# Patient Record
Sex: Female | Born: 1995 | Race: White | Hispanic: No | Marital: Married | Smoking: Never smoker
Health system: Southern US, Community
[De-identification: ages and names within clinical notes are randomized; demographics above are authoritative.]

## PROBLEM LIST (undated history)

## (undated) ENCOUNTER — Inpatient Hospital Stay: Payer: Self-pay

## (undated) DIAGNOSIS — D649 Anemia, unspecified: Secondary | ICD-10-CM

## (undated) DIAGNOSIS — E559 Vitamin D deficiency, unspecified: Secondary | ICD-10-CM

## (undated) DIAGNOSIS — K219 Gastro-esophageal reflux disease without esophagitis: Secondary | ICD-10-CM

## (undated) DIAGNOSIS — R569 Unspecified convulsions: Secondary | ICD-10-CM

## (undated) DIAGNOSIS — N809 Endometriosis, unspecified: Secondary | ICD-10-CM

## (undated) DIAGNOSIS — E282 Polycystic ovarian syndrome: Secondary | ICD-10-CM

## (undated) HISTORY — DX: Anemia, unspecified: D64.9

## (undated) HISTORY — PX: WISDOM TOOTH EXTRACTION: SHX21

## (undated) HISTORY — DX: Endometriosis, unspecified: N80.9

## (undated) HISTORY — DX: Vitamin D deficiency, unspecified: E55.9

## (undated) HISTORY — PX: KNEE ARTHROSCOPY: SUR90

---
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
## 2014-02-15 ENCOUNTER — Ambulatory Visit: Payer: Self-pay | Admitting: Family Medicine

## 2014-03-14 ENCOUNTER — Ambulatory Visit: Payer: Self-pay

## 2014-06-02 NOTE — H&P (Signed)
PATIENT NAMFransico Meadow:  SNUFFER, Blythe L MR#:  [id number] DATE OF BIRTH:  04-Jun-1995  DATE OF ADMISSION:  04/05/2012  ADMITTING DIAGNOSES:  1.  Gastroenteritis.  2.  Dehydration.   HISTORY OF PRESENT ILLNESS:  This is the first [facility] admission for this 19 year old white female who was in her usual state of good health until the day of admission at which time she developed vomiting and diarrhea. The vomiting was described as nonbilious, non-projectile, 8 to 10 times over the previous 12 hours and had progressed to the point of dry heaving. The diarrhea was described 8 to 10 with no blood or mucus and mild abdominal cramping. There was no history of fever. The patient took less than 8 ounces of Pedialyte on day of admission and had voided 3 times with small amounts. The patient was seen in the office on the evening of admission and was found to be clinically moderately dehydrated. The pulse lying was 107, sitting 110 and standing 124. The blood pressure lying was 110/80, sitting 101/80 and standing 98/80. Oximetry was 98%. Options were discussed with the patient and her mother regarding rehydration and further evaluation. It was elected to admit the patient for further evaluation and treatment.   ADMISSION PHYSICAL EXAMINATION: VITALS: Temperature was 98 degrees, weight was 112, oximetry was 98% on room air, pulse was 107, blood pressure 110/80.  GENERAL: She was a well-developed moderately dehydrated adolescent appearing uncomfortable, but in no acute distress.  HEENT: Pupils were equal, round, and reactive to light. EOMs were full. Tympanic membranes are clear. Nose was clear. Oropharynx had dry mucosa. Lips were dry and chapped.  NECK: Supple. There were no enlarged lymph glands.  LUNGS: Fields were clear to auscultation.  HEART: There was a slightly tachycardic heart rate that was regular.  EXTREMITIES: Capillary refill was less than 2 seconds and pulses were 2+.  ABDOMEN: Soft  without distention, masses or organomegaly. Bowel sounds were hyperactive. There was mild tenderness on the right side of the abdomen without guarding or rebound tenderness. The abdomen was nondistended. There was no enlarged liver or spleen.  SKIN: Dry lips and mucosa, but there was good skin turgor.  NEUROLOGIC: There were no deficits or focal findings.   ASSESSMENT:  Approximately a 12 to 14 hour history of vomiting and diarrhea progressing to the point of clinically moderate dehydration.   PLAN:  Please see orders.    ____________________________ Tresa Resavid S. Johnson, MD dsj:si D: 04/05/2012 22:12:00 ET T: 04/06/2012 00:03:21 ET JOB#: [id number]  cc: Tresa Resavid S. Johnson, MD, <Dictator> DAVID Henriette CombsS JOHNSON MD ELECTRONICALLY SIGNED 04/06/2012 10:18

## 2014-08-11 DIAGNOSIS — R569 Unspecified convulsions: Secondary | ICD-10-CM

## 2014-08-11 HISTORY — DX: Unspecified convulsions: R56.9

## 2014-11-03 ENCOUNTER — Ambulatory Visit: Admission: RE | Admit: 2014-11-03 | Payer: BC Managed Care – PPO | Source: Ambulatory Visit

## 2014-11-06 ENCOUNTER — Encounter
Admission: RE | Admit: 2014-11-06 | Discharge: 2014-11-06 | Disposition: A | Discharge status: Home / Self Care | Payer: BC Managed Care – PPO | Source: Ambulatory Visit | Attending: Obstetrics and Gynecology | Admitting: Obstetrics and Gynecology

## 2014-11-06 DIAGNOSIS — Z01818 Encounter for other preprocedural examination: Secondary | ICD-10-CM | POA: Diagnosis present

## 2014-11-06 DIAGNOSIS — R102 Pelvic and perineal pain: Secondary | ICD-10-CM | POA: Diagnosis not present

## 2014-11-06 DIAGNOSIS — G8929 Other chronic pain: Secondary | ICD-10-CM | POA: Insufficient documentation

## 2014-11-06 HISTORY — DX: Unspecified convulsions: R56.9

## 2014-11-06 HISTORY — DX: Gastro-esophageal reflux disease without esophagitis: K21.9

## 2014-11-06 NOTE — Patient Instructions (Signed)
  Your procedure is scheduled on: November 10, 2014 (Friday) Report to Day Surgery.[facility]) To find out your arrival time please call [phone] between 1PM - 3PM on November 09, 2014 (Thursday).  Remember: Instructions that are not followed completely may result in serious medical risk, up to and including death, or upon the discretion of your surgeon and anesthesiologist your surgery may need to be rescheduled.    __x__ 1. Do not eat food or drink liquids after midnight. No gum chewing or hard candies.     ____ 2. No Alcohol for 24 hours before or after surgery.   ____ 3. Bring all medications with you on the day of surgery if instructed.    __x__ 4. Notify your doctor if there is any change in your medical condition     (cold, fever, infections).     Do not wear jewelry, make-up, hairpins, clips or nail polish.  Do not wear lotions, powders, or perfumes. You may wear deodorant.  Do not shave 48 hours prior to surgery. Men may shave face and neck.  Do not bring valuables to the hospital.    [facility] is not responsible for any belongings or valuables.               Contacts, dentures or bridgework may not be worn into surgery.  Leave your suitcase in the car. After surgery it may be brought to your room.  For patients admitted to the hospital, discharge time is determined by your                treatment team.   Patients discharged the day of surgery will not be allowed to drive home.   Please read over the following fact sheets that you were given:   Surgical Site Infection Prevention   ____ Take these medicines the morning of surgery with A SIP OF WATER:    1.  2.   3.   4.  5.  6.  ____ Fleet Enema (as directed)   _x___ Use CHG Soap as directed  ____ Use inhalers on the day of surgery  ____ Stop metformin 2 days prior to surgery    ____ Take 1/2 of usual insulin dose the night before surgery and none on the morning of surgery.   ____ Stop  Coumadin/Plavix/aspirin on   ____ Stop Anti-inflammatories on    ____ Stop supplements until after surgery.    ____ Bring C-Pap to the hospital.

## 2014-11-10 ENCOUNTER — Encounter: Payer: Self-pay | Admitting: *Deleted

## 2014-11-10 ENCOUNTER — Encounter
Admission: RE | Disposition: A | Discharge status: Home / Self Care | Payer: Self-pay | Source: Ambulatory Visit | Attending: Obstetrics and Gynecology

## 2014-11-10 ENCOUNTER — Ambulatory Visit: Payer: BC Managed Care – PPO | Admitting: *Deleted

## 2014-11-10 ENCOUNTER — Ambulatory Visit
Admission: RE | Admit: 2014-11-10 | Discharge: 2014-11-10 | Disposition: A | Discharge status: Home / Self Care | Payer: BC Managed Care – PPO | Source: Ambulatory Visit | Attending: Obstetrics and Gynecology | Admitting: Obstetrics and Gynecology

## 2014-11-10 DIAGNOSIS — Z79899 Other long term (current) drug therapy: Secondary | ICD-10-CM | POA: Diagnosis not present

## 2014-11-10 DIAGNOSIS — K219 Gastro-esophageal reflux disease without esophagitis: Secondary | ICD-10-CM | POA: Insufficient documentation

## 2014-11-10 DIAGNOSIS — Z8049 Family history of malignant neoplasm of other genital organs: Secondary | ICD-10-CM | POA: Insufficient documentation

## 2014-11-10 DIAGNOSIS — R102 Pelvic and perineal pain: Secondary | ICD-10-CM | POA: Diagnosis present

## 2014-11-10 DIAGNOSIS — Z833 Family history of diabetes mellitus: Secondary | ICD-10-CM | POA: Diagnosis not present

## 2014-11-10 DIAGNOSIS — G8929 Other chronic pain: Secondary | ICD-10-CM | POA: Diagnosis not present

## 2014-11-10 DIAGNOSIS — N803 Endometriosis of pelvic peritoneum: Secondary | ICD-10-CM | POA: Diagnosis not present

## 2014-11-10 DIAGNOSIS — R29818 Other symptoms and signs involving the nervous system: Secondary | ICD-10-CM | POA: Diagnosis not present

## 2014-11-10 DIAGNOSIS — Z8249 Family history of ischemic heart disease and other diseases of the circulatory system: Secondary | ICD-10-CM | POA: Insufficient documentation

## 2014-11-10 HISTORY — PX: CYSTOSCOPY: SHX5120

## 2014-11-10 HISTORY — PX: LAPAROSCOPY: SHX197

## 2014-11-10 LAB — POCT PREGNANCY, URINE: PREG TEST UR: NEGATIVE

## 2014-11-10 SURGERY — LAPAROSCOPY, DIAGNOSTIC
Anesthesia: General

## 2014-11-10 MED ORDER — PHENYLEPHRINE HCL 10 MG/ML IJ SOLN
INTRAMUSCULAR | Status: DC | PRN
  Administered 2014-11-10 (×3): 100 ug via INTRAVENOUS

## 2014-11-10 MED ORDER — FENTANYL CITRATE (PF) 100 MCG/2ML IJ SOLN
25.0000 ug | INTRAMUSCULAR | Status: DC | PRN
  Administered 2014-11-10 (×3): 50 ug via INTRAVENOUS

## 2014-11-10 MED ORDER — FENTANYL CITRATE (PF) 100 MCG/2ML IJ SOLN
INTRAMUSCULAR | Status: DC | PRN
  Administered 2014-11-10: 100 ug via INTRAVENOUS

## 2014-11-10 MED ORDER — ONDANSETRON HCL 4 MG/2ML IJ SOLN
INTRAMUSCULAR | Status: DC | PRN
  Administered 2014-11-10: 4 mg via INTRAVENOUS

## 2014-11-10 MED ORDER — IBUPROFEN 600 MG PO TABS: 600.0000 mg | ORAL_TABLET | Freq: Four times a day (QID) | ORAL | Status: DC | PRN

## 2014-11-10 MED ORDER — FENTANYL CITRATE (PF) 100 MCG/2ML IJ SOLN
INTRAMUSCULAR | Status: AC
  Administered 2014-11-10: 50 ug via INTRAVENOUS
  Filled 2014-11-10: qty 2

## 2014-11-10 MED ORDER — EPHEDRINE SULFATE 50 MG/ML IJ SOLN
INTRAMUSCULAR | Status: DC | PRN
  Administered 2014-11-10: 10 mg via INTRAVENOUS

## 2014-11-10 MED ORDER — FAMOTIDINE 20 MG PO TABS
20.0000 mg | ORAL_TABLET | Freq: Once | ORAL | Status: AC
  Administered 2014-11-10: 20 mg via ORAL

## 2014-11-10 MED ORDER — BUPIVACAINE HCL (PF) 0.5 % IJ SOLN
INTRAMUSCULAR | Status: AC
  Filled 2014-11-10: qty 30

## 2014-11-10 MED ORDER — OXYCODONE-ACETAMINOPHEN 5-325 MG PO TABS: 1.0000 | ORAL_TABLET | Freq: Four times a day (QID) | ORAL | Status: DC | PRN

## 2014-11-10 MED ORDER — OXYCODONE-ACETAMINOPHEN 5-325 MG PO TABS
ORAL_TABLET | ORAL | Status: AC
  Filled 2014-11-10: qty 1

## 2014-11-10 MED ORDER — NEOSTIGMINE METHYLSULFATE 10 MG/10ML IV SOLN
INTRAVENOUS | Status: DC | PRN
  Administered 2014-11-10: 4 mg via INTRAVENOUS

## 2014-11-10 MED ORDER — OXYCODONE-ACETAMINOPHEN 5-325 MG PO TABS
1.0000 | ORAL_TABLET | Freq: Four times a day (QID) | ORAL | Status: DC | PRN
  Administered 2014-11-10: 1 via ORAL

## 2014-11-10 MED ORDER — LACTATED RINGERS IV SOLN
INTRAVENOUS | Status: DC | PRN
  Administered 2014-11-10: 13:00:00 via INTRAVENOUS

## 2014-11-10 MED ORDER — PROMETHAZINE HCL 25 MG/ML IJ SOLN: 6.2500 mg | INTRAMUSCULAR | Status: DC | PRN

## 2014-11-10 MED ORDER — GLYCOPYRROLATE 0.2 MG/ML IJ SOLN
INTRAMUSCULAR | Status: DC | PRN
  Administered 2014-11-10: 0.6 mg via INTRAVENOUS

## 2014-11-10 MED ORDER — FENTANYL CITRATE (PF) 100 MCG/2ML IJ SOLN
INTRAMUSCULAR | Status: AC
  Filled 2014-11-10: qty 2

## 2014-11-10 MED ORDER — MIDAZOLAM HCL 2 MG/2ML IJ SOLN
INTRAMUSCULAR | Status: DC | PRN
  Administered 2014-11-10: 2 mg via INTRAVENOUS

## 2014-11-10 MED ORDER — LACTATED RINGERS IV SOLN
INTRAVENOUS | Status: DC
  Administered 2014-11-10: 13:00:00 via INTRAVENOUS

## 2014-11-10 MED ORDER — FAMOTIDINE 20 MG PO TABS
ORAL_TABLET | ORAL | Status: AC
  Filled 2014-11-10: qty 1

## 2014-11-10 SURGICAL SUPPLY — 39 items
BAG URO DRAIN 2000ML W/SPOUT (MISCELLANEOUS) ×3 IMPLANT
BLADE SURG SZ11 CARB STEEL (BLADE) ×3 IMPLANT
CANISTER SUCT 1200ML W/VALVE (MISCELLANEOUS) ×3 IMPLANT
CATH FOLEY 2WAY  5CC 16FR (CATHETERS) ×2
CATH ROBINSON RED A/P 16FR (CATHETERS) ×3 IMPLANT
CATH URTH 16FR FL 2W BLN LF (CATHETERS) ×1 IMPLANT
CHLORAPREP W/TINT 26ML (MISCELLANEOUS) ×3 IMPLANT
DRAPE LAP W/FLUID (DRAPES) ×3 IMPLANT
DRAPE UNDER BUTTOCK W/FLU (DRAPES) ×3 IMPLANT
DRSG TEGADERM 2-3/8X2-3/4 SM (GAUZE/BANDAGES/DRESSINGS) ×3 IMPLANT
GAUZE SPONGE NON-WVN 2X2 STRL (MISCELLANEOUS) ×1 IMPLANT
GLOVE BIO SURGEON STRL SZ7 (GLOVE) ×12 IMPLANT
GLOVE INDICATOR 7.5 STRL GRN (GLOVE) ×12 IMPLANT
GOWN STRL REUS W/ TWL LRG LVL3 (GOWN DISPOSABLE) ×2 IMPLANT
GOWN STRL REUS W/TWL LRG LVL3 (GOWN DISPOSABLE) ×4
IRRIGATION STRYKERFLOW (MISCELLANEOUS) ×1 IMPLANT
IRRIGATOR STRYKERFLOW (MISCELLANEOUS) ×3
IV LACTATED RINGERS 1000ML (IV SOLUTION) ×3 IMPLANT
KIT RM TURNOVER CYSTO AR (KITS) ×3 IMPLANT
LABEL OR SOLS (LABEL) ×3 IMPLANT
LIQUID BAND (GAUZE/BANDAGES/DRESSINGS) ×3 IMPLANT
NS IRRIG 500ML POUR BTL (IV SOLUTION) ×3 IMPLANT
PACK GYN LAPAROSCOPIC (MISCELLANEOUS) ×3 IMPLANT
PAD OB MATERNITY 4.3X12.25 (PERSONAL CARE ITEMS) ×3 IMPLANT
PAD PREP 24X41 OB/GYN DISP (PERSONAL CARE ITEMS) ×3 IMPLANT
SCISSORS METZENBAUM CVD 33 (INSTRUMENTS) IMPLANT
SET CYSTO W/LG BORE CLAMP LF (SET/KITS/TRAYS/PACK) ×3 IMPLANT
SHEARS HARMONIC ACE PLUS 36CM (ENDOMECHANICALS) ×3 IMPLANT
SLEEVE ENDOPATH XCEL 5M (ENDOMECHANICALS) ×3 IMPLANT
SPONGE VERSALON 2X2 STRL (MISCELLANEOUS) ×2
SPONGE XRAY 4X4 16PLY STRL (MISCELLANEOUS) ×3 IMPLANT
SURGILUBE 2OZ TUBE FLIPTOP (MISCELLANEOUS) ×3 IMPLANT
SUT MNCRL AB 4-0 PS2 18 (SUTURE) IMPLANT
SUT VIC AB 0 CT2 27 (SUTURE) IMPLANT
SYRINGE 10CC LL (SYRINGE) ×3 IMPLANT
TROCAR ENDO BLADELESS 11MM (ENDOMECHANICALS) IMPLANT
TROCAR XCEL NON-BLD 5MMX100MML (ENDOMECHANICALS) ×3 IMPLANT
TUBING INSUFFLATOR HI FLOW (MISCELLANEOUS) ×3 IMPLANT
WATER STERILE IRR 3000ML UROMA (IV SOLUTION) ×3 IMPLANT

## 2014-11-10 NOTE — Anesthesia Preprocedure Evaluation (Signed)
Anesthesia Evaluation  Patient identified by MRN, date of birth, ID band Patient awake    Reviewed: Allergy & Precautions, H&P , NPO status , Patient's Chart, lab work & pertinent test results, reviewed documented beta blocker date and time   History of Anesthesia Complications (+) PONV and history of anesthetic complications  Airway Mallampati: I  TM Distance: >3 FB Neck ROM: full    Dental no notable dental hx. (+) Teeth Intact   Pulmonary neg pulmonary ROS,    Pulmonary exam normal breath sounds clear to auscultation       Cardiovascular Exercise Tolerance: Good negative cardio ROS Normal cardiovascular exam Rhythm:regular Rate:Normal     Neuro/Psych Seizures - (pseudoseizures),  negative psych ROS   GI/Hepatic Neg liver ROS, GERD  ,  Endo/Other  negative endocrine ROS  Renal/GU negative Renal ROS  negative genitourinary   Musculoskeletal   Abdominal   Peds  Hematology negative hematology ROS (+)   Anesthesia Other Findings Past Medical History:   GERD (gastroesophageal reflux disease)                       Seizures                                        July 2016      Comment:pseudo seizures   Reproductive/Obstetrics negative OB ROS                             Anesthesia Physical Anesthesia Plan  ASA: I  Anesthesia Plan: General   Post-op Pain Management:    Induction:   Airway Management Planned:   Additional Equipment:   Intra-op Plan:   Post-operative Plan:   Informed Consent: I have reviewed the patients History and Physical, chart, labs and discussed the procedure including the risks, benefits and alternatives for the proposed anesthesia with the patient or authorized representative who has indicated his/her understanding and acceptance.   Dental Advisory Given  Plan Discussed with: Anesthesiologist, CRNA and Surgeon  Anesthesia Plan Comments:          Anesthesia Quick Evaluation

## 2014-11-10 NOTE — H&P (Signed)
Date of Initial paper H&P: 11/06/2014  History reviewed, patient examined, no change in status, stable for surgery.

## 2014-11-10 NOTE — Transfer of Care (Signed)
Immediate Anesthesia Transfer of Care Note  Patient: Theresa Knight  Procedure(s) Performed: Procedure(s): LAPAROSCOPY DIAGNOSTIC with biopsies (N/A) CYSTOSCOPY (N/A)  Patient Location: PACU  Anesthesia Type:General  Level of Consciousness: awake  Airway & Oxygen Therapy: Patient Spontanous Breathing  Post-op Assessment: Report given to RN  Post vital signs: Reviewed and stable  Last Vitals:  Filed Vitals:   11/10/14 1225  BP: 136/70  Pulse: 103  Temp: 36.8 C  Resp: 16    Complications: No apparent anesthesia complications

## 2014-11-10 NOTE — Transfer of Care (Signed)
Immediate Anesthesia Transfer of Care Note  Patient: Theresa Knight  Procedure(s) Performed: Procedure(s): LAPAROSCOPY DIAGNOSTIC with biopsies (N/A) CYSTOSCOPY (N/A)  Patient Location: PACU  Anesthesia Type:General  Level of Consciousness: awake and alert   Airway & Oxygen Therapy: Patient Spontanous Breathing and Patient connected to face mask oxygen  Post-op Assessment: Report given to RN and Post -op Vital signs reviewed and stable  Post vital signs: Reviewed and stable  Last Vitals:  Filed Vitals:   11/10/14 1225  BP: 136/70  Pulse: 103  Temp: 36.8 C  Resp: 16    Complications: No apparent anesthesia complications

## 2014-11-10 NOTE — Op Note (Signed)
Preoperative Diagnosis: 1) 19 y.o.  G0 with chronic pelvic pain  Postoperative Diagnosis: 1) 19 y.o. G0 with chronic pelvic pain 2) Endometriosis  Operation Performed: Diagnostic laparoscopic, cul de sac and right ovarian fossa biopsies, cystoscopy  Indication: 19 y.o. with chronic pelvic pain, suspicion of endometriosis.    Anesthesia: General  Preoperative Antibiotics: none  Estimated Blood Loss: minimal  IV Fluids: 1L  Drains or Tubes: none  Implants: none  Specimens Removed: cul de sac and right ovarian fossa peritoneal biopsies.  Complications: none  Intraoperative Findings: Normal tubes, ovaries, and uterus.  There were multiple small endometriosis implant in the cul de sac and right ovarian fossa, two of which were biopsied. Normal appendix, liver, ureters.  No evidence of hernia.  Cystoscopy with normal findings.  Patient Condition: stable  Procedure in Detail:  Patient was taken to the operating room where she was administered general anesthesia.  She was positioned in the dorsal lithotomy position utilizing Allen stirups, prepped and draped in the usual sterile fashion.  Prior to proceeding with procedure a time out was performed.  Attention was turned to the patient's pelvis.  A red rubber catheter was used to empty the patient's bladder.  An operative speculum was placed to allow visualization of the cervix.  The anterior lip of the cervix was grasped with a single tooth tenaculum, and a Hulka tenaculum was placed to allow manipulation of the uterus.  The operative speculum and single tooth tenaculum were then removed.  Attention was turned to the patient's abdomen.  The umbilicus was infiltrated with 1% Sensorcaine, before making a stab incision using an 11 blade scalpel.  A Port Jefferson Surgery CenteElv4Wayne Unc HealthcarElviCollie SiadAngstazerhen used to gain direct entry into the peritoneal cavity utilizing the camera to visualize progress of the trocar during placement.  Once peritoneal entry had been achieved,  insufflat3Quincy Medical CenteElviCollie SiadAngstazerneumoperitoneum established at a pressure of 15mmHgWhiting For iD779-[facility] - [phone] 1.6Devoria AMaryla561-097Glen Oaks HospKentuckyital4ConocoPhillips254na Hitch lower quadr94maAdri2.8(UEye SurEye Center Of North Florida Dba The Laser And gD63Mhp Medical [phone] 1.6Devoria [phone] Center Of Coastal GeorgiaKentucky LLC4ConocoPhillips237na Hitchr Of Hinsdal21meAdri2.8(UNavUt Health East aD934-Summit Medical [phone] 1.6Devoria AMaryla684-887Hedwig Asc LLC Dba [facility] Premier Surgery Center In The VillKen3Holland Community HospitaElviCollie SiadAngstazerips297na Hitchl Camp Pendl65m(Adri2.8([place] J Samson Comm tD(636)Oj[place] Valley Community [phone] 1.6Devoria AMaryla([phone] Community Hospital Medical CenteElviCollie SiadAngstazertals Ahuja Medical CeKentuckynter4ConocoPhillips293na Hitchmorial Hosp(35m2Adri2.[facility] Hos aD401 Braz[facility] County Surgery CenterKorea [phone] 1.6D2Presbyterian Rust Medical CenteElviCollie SiadAngstazer1Advanced Eye Surgery CenteKentuckyr Pa4ConocoPhillips2[facility] Special rD(858)Weatherford Regional [phone] 1.6Devoria AMaryla708-359Griffin Memorial HospKentuckyital4ConocoPhillips213na Hitchoscopy Cent2Marshfield Medical Ctr NeillsvillElviCollie SiadAngstazerda Hea cD(937)[facility] -Korea [phone] 1.6Devoria [phone] Specialty Hospital - GreensKent6Prairie View InElviCollie SiadAngstazerps293na Hitchs County Hos6mpAdri2.8(UMonroe County Surgical Center46m(Adri2.8(UAcuity Specialty Banner Estrella iD334-Brandywine [phone] 1.6Devoria AMary8Good Shepherd Rehabilitation HospitaElviCollie SiadAngstazerome Of PittsbKentuckyurgh4ConocoPhillips2[place] iD641 [facility] [phone] 1.6Devoria AMaryla215 293Providence Kodi7Coosa Valley Medical CenteElv6Sjrh - Park Care PavilioElviCollie SiadAngstazerentuckynter4ConocoPhillips287na Hitchhio Valley W89mhAdri2.8(UIntegriTenaya Surgi D567-Blue Mountain [phone] 1.6Devoria AMaryla408-816Foothill Surgery CenteKentuc6Washington County HospitaElviCollie SiadAngstazer272na Hitch Valley Hosp24m(Adri2Westwood/Pembroke Health S eD210-Canonsburg General [phone] 1.6Devoria [phone] Memorial HospKe5Endoscopic Procedure Center LLElviCollie SiadAngstazerlips280na Hitch Fnd Hosp - 19mFAdri2.8(UAlta View Hos6Endoscopy Cent OD401 Capital Orthopedic Surgery CenterKorea 4Ma(6066Regional Hand Center Of [facility] [place] InElviCollie SiadAngstazer6Devoria AMaryla662-670Jefferson County HospKentuckyital4ConocoPhillips255na Hitch(UCopley Hos53mpAdriTuality Forest Gro HD240-Pinecrest Eye CenterKorea [phone] 1.6Devoria AMaryla726 504Hospit4Miami Surgical CenteElviCollie SiadAngstazerConocoPhillips2Surgery Center O tD(616) Regency Hospital Of Fort [phone] 1.6Devoria [phone] Hospital Of KeKenElvina BQuentin Angstazerna Hitchain View Hos29mpAdri2.8(UBaptist Health56m Adri2[place] Comm tD(236) Copper Queen Community [phone] 1.6Devoria [phone] Regional HospKentuckyital4ConocoPhillips266na Hitchas HeaOutpatient Surgery Center Of La JollElviCollie SiadAngstazerand Asc LLC Dba Cleveland S iD215-[facility]-Mt Clemens [phone] 1.6Devoria [phone] Rockies Surgery CenteKentuckyr LP4ConocoPhillips286na Hitcha Surg6Riddle Surgical Center LLElv5Coliseum Medical CenterElviCollie SiadAngstazeriley Square AmbulBaylor Scott And White The Heart H iD260-[place] Diagnostic ClinicKorea [phone] 1.6Devoria AMaryla838-222Rush Memorial HospKentuckyital4ConocoPhillips297na Hitchical Center(59m2Adri2.8(UWisconsin Inst<MEASUREMEKoreaNM13Fall River Health ServiceElviCollie SiadAngstazeroPhillipslThe Orthopaedic And Spine Center Of Souther oD681-Mayo Clinic Health Sys [phone] 1.6Devoria AMaryla332-582Texas [facility]6Pearl Road Surgery Center LLElviCollie SiadAngstazerKentuckycopy4ConocoPhil2Parkway Regional HospitaElviCollie SiadAngstazerellenc67meAdriClarion Psyc tD217-Five River Medical [phone] 1.6Devoria AMaryla323-274Boulder Spine CenterKentucky LLC4ConocoPhillips286na Hitcher City Hosp53m(Adri2.8(UQuince Orchard Surg5The Surgery Center Of Aiken LLElviCollie SiadAngstazerUCommunityMarshall Medica eD(718)[facility]-[place], [phone] 1.6Devoria [phone] Warm Springs Ltac HospKentuckyital4ConocoPhillips209na Hitchnd Laser Cen36mtAdri2.8(UCrescSentara Norfolk Ge aD646-Roosevelt General [phone] 1.6Devoria AMaryla218-796Kiowa County Memorial HospKentuckyital4Con5San Antonio Gastroenterology Endoscopy Center NortElv5Iu Health Saxony HospitaElviCollie SiadAngstazerhurgery Cente7mrAdri2.8(UCaHansford C tD(412)Alta Bates Summit Med Ctr-Alta Bates [phone] 1.6Devoria AMaryla906-034[facility] [place],Kentucky LLC4ConocoPhillips280na Hitchey Medical C69meAdri2.8(USDickinson County Mem aD(416)[facility] - Ic [phone] 1.6Devoria AMaryla779-885[facility] Craig RKentuckyanch4ConocoPhillips277na HitchOf Greater7Golden Gate Endoscopy Center LLElviCollie SiadAngstazeridePlastic SurgDigestive Care Center EvansvillElv4Healthsouth Rehabilitation Hospital Of AustiElviCollie SiadAngstazer>MD825-Specialty Hospital Of [phone] 1.6Devoria AMaryla431-132Carolina Surgery Center LLC Dba The Surgery Center At EdgewKentuckyater4ConocoPhillips279na Hitchy Surgery Ce3m(Adri2.8(UMetairie Ophthalmology As74mcAdri2.8(UPierJoliet Surgery Center Limit PD301-Hendrick Medical [phone] Ambulatory Surgical CenteElviCollie SiadAngstazerria AMaryla66925Minden Medical CeKentuckynter4ConocoPhillips263na HitchSame Day Sur36mgAdri2.8(USaHansen F lD90Dayton General [phone] 1.6Devoria AMaryla571-887[place] Co9Summit Surgery Center LLElviCollie SiadAngstazertuckyital4ConocoPhillips233na Hitchas Medical C30meAdri2.8(UKBibb iD92Mckenzie Surgery [phone] 1.6Devoria AMaryla612-225Wagoner Community HospKentuckyital4ConocoPhillips286na Hitch3Goryeb Childrens CenteElviCollie SiadAngstazer.8(UNorthwestAlta Bates Summit Med Ctr-Summit Ca sD712-Lifecare Hospitals Of Fort [phone] 1.6Devoria [phone] Ambulatory Surgery CeKentuckynter4ConocoPhillips204na Hitchhiatric Hosp15m(Adri2.8(UEndo Surgi Center Of Old [phone] Skeenshen placed under direct visualization after transiluminating the abdominal wall.  General inspection of the abdomen revealed the above noted findings. A biopsy forceps was utilized to obtain the two peritoneal biopsies.  Pneumoperitoneum was evacuated.  The trocars were removed.  All trocar sites were then dressed with surgical skin glue.  The Hulka tenaculum was removed.  The cystoscope was advanced into the patient's bladder noting normal findings as documented above.  Sponge needle and instrument counts were correct time two.  The patient tolerated the procedure well and was taken to the recovery room in stable condition.

## 2014-11-10 NOTE — Anesthesia Procedure Notes (Signed)
Procedure Name: Intubation Performed by: Edyth Gunnels Pre-anesthesia Checklist: Patient identified, Suction available, Patient being monitored and Timeout performed Patient Re-evaluated:Patient Re-evaluated prior to inductionOxygen Delivery Method: Circle system utilized Preoxygenation: Pre-oxygenation with 100% oxygen Intubation Type: IV induction Ventilation: Mask ventilation without difficulty Laryngoscope Size: 3 Grade View: Grade II Tube type: Oral Tube size: 7.0 mm Number of attempts: 1 Airway Equipment and Method: Stylet Placement Confirmation: ETT inserted through vocal cords under direct vision,  positive ETCO2 and breath sounds checked- equal and bilateral Secured at: 21 cm Tube secured with: Tape

## 2014-11-15 LAB — SURGICAL PATHOLOGY

## 2014-11-16 NOTE — Anesthesia Postprocedure Evaluation (Signed)
  Anesthesia Post-op Note  Patient: Theresa Knight  Procedure(s) Performed: Procedure(s): LAPAROSCOPY DIAGNOSTIC with biopsies (N/A) CYSTOSCOPY (N/A)  Anesthesia type:General  Patient location: PACU  Post pain: Pain level controlled  Post assessment: Post-op Vital signs reviewed, Patient's Cardiovascular Status Stable, Respiratory Function Stable, Patent Airway and No signs of Nausea or vomiting  Post vital signs: Reviewed and stable  Last Vitals:  Filed Vitals:   11/10/14 1609  BP: 108/53  Pulse:   Temp:   Resp:     Level of consciousness: awake, alert  and patient cooperative  Complications: No apparent anesthesia complications

## 2016-01-29 ENCOUNTER — Encounter: Payer: Self-pay | Admitting: Obstetrics and Gynecology

## 2016-01-29 ENCOUNTER — Other Ambulatory Visit: Payer: Self-pay | Admitting: Obstetrics and Gynecology

## 2016-01-29 ENCOUNTER — Ambulatory Visit (INDEPENDENT_AMBULATORY_CARE_PROVIDER_SITE_OTHER): Payer: BC Managed Care – PPO | Admitting: Obstetrics and Gynecology

## 2016-01-29 VITALS — BP 122/82 | HR 79 | Ht 61.0 in | Wt 132.1 lb

## 2016-01-29 DIAGNOSIS — N809 Endometriosis, unspecified: Secondary | ICD-10-CM | POA: Diagnosis not present

## 2016-01-29 DIAGNOSIS — N926 Irregular menstruation, unspecified: Secondary | ICD-10-CM | POA: Diagnosis not present

## 2016-01-29 DIAGNOSIS — Z23 Encounter for immunization: Secondary | ICD-10-CM

## 2016-01-29 DIAGNOSIS — Z01411 Encounter for gynecological examination (general) (routine) with abnormal findings: Secondary | ICD-10-CM | POA: Diagnosis not present

## 2016-01-29 NOTE — Patient Instructions (Signed)
  Place annual gynecologic exam patient instructions here.  Thank you for enrolling in MyChart. Please follow the instructions below to securely access your online medical record. MyChart allows you to send messages to your doctor, view your test results, manage appointments, and more.   How Do I Sign Up? 1. In your Internet browser, go to [employer] Address Bar and enter [email or web address]. 2. Click on the Sign Up Now link in the Sign In box. You will see the New Member Sign Up page. 3. Enter your MyChart Access Code exactly as it appears below. You will not need to use this code after you've completed the sign-up process. If you do not sign up before the expiration date, you must request a new code.  MyChart Access Code: [email or web address] Expires: 03/29/2016  1:13 PM  4. Enter your Social Security Number ([id number]) and Date of Birth (mm/dd/yyyy) as indicated and click Submit. You will be taken to the next sign-up page. 5. Create a MyChart ID. This will be your MyChart login ID and cannot be changed, so think of one that is secure and easy to remember. 6. Create a MyChart password. You can change your password at any time. 7. Enter your Password Reset Question and Answer. This can be used at a later time if you forget your password.  8. Enter your e-mail address. You will receive e-mail notification when new information is available in MyChart. 9. Click Sign Up. You can now view your medical record.   Additional Information Remember, MyChart is NOT to be used for urgent needs. For medical emergencies, dial 911.

## 2016-01-29 NOTE — Progress Notes (Signed)
   Subjective:     Theresa Knight is a 20 y.o. female and is here for a comprehensive physical exam. The patient reports diagnosis with endometriosis last year, is tring for pregnancy since March of this year. Only menses since stopping OCPs was in July. Has some pain with sex in certain positions. .  Social History   Social History  . Marital status: Married    Spouse name: N/A  . Number of children: N/A  . Years of education: N/A   Occupational History  . Not on file.   Social History Main Topics  . Smoking status: Never Smoker  . Smokeless tobacco: Never Used  . Alcohol use No  . Drug use: No  . Sexual activity: Yes   Other Topics Concern  . Not on file   Social History Narrative  . No narrative on file   Health Maintenance  Topic Date Due  . CHLAMYDIA SCREENING  07/21/2009  . HIV Screening  07/21/2009  . TETANUS/TDAP  07/21/2013  . PAP SMEAR  07/22/2015  . INFLUENZA VACCINE  09/11/2015    The following portions of the patient's history were reviewed and updated as appropriate: allergies, current medications, past family history, past medical history, past social history, past surgical history and problem list.  Review of Systems Pertinent items noted in HPI and remainder of comprehensive ROS otherwise negative.   Objective:    General appearance: alert, cooperative and appears stated age Neck: no adenopathy, no carotid bruit, no JVD, supple, symmetrical, trachea midline and thyroid not enlarged, symmetric, no tenderness/mass/nodules Lungs: clear to auscultation bilaterally Breasts: normal appearance, no masses or tenderness Heart: regular rate and rhythm, S1, S2 normal, no murmur, click, rub or gallop Abdomen: soft, non-tender; bowel sounds normal; no masses,  no organomegaly Pelvic: cervix normal in appearance, external genitalia normal, no adnexal masses or tenderness, no cervical motion tenderness, rectovaginal septum normal, uterus normal size, shape,  and consistency and vagina normal without discharge    Assessment:    Healthy female exam. H/o endometriosis,pelvic pain, irregular menses.     Plan:  Will return tomorrow for labs and pelvic u/s Flu vaccine given today RTC 1 year for AE RTC 2 weeks to review labs and u/s findings and develp plan to try to get pregnant.  Theresa Knight, CNM   See After Visit Summary for Counseling Recommendations

## 2016-01-30 ENCOUNTER — Other Ambulatory Visit: Payer: BC Managed Care – PPO

## 2016-01-30 ENCOUNTER — Ambulatory Visit (INDEPENDENT_AMBULATORY_CARE_PROVIDER_SITE_OTHER): Payer: BC Managed Care – PPO

## 2016-01-30 DIAGNOSIS — N926 Irregular menstruation, unspecified: Secondary | ICD-10-CM | POA: Diagnosis not present

## 2016-01-31 LAB — CYTOLOGY - PAP

## 2016-02-01 LAB — COMPREHENSIVE METABOLIC PANEL
A/G RATIO: 1.7 (ref 1.2–2.2)
ALBUMIN: 4.3 g/dL (ref 3.5–5.5)
ALK PHOS: 59 IU/L (ref 39–117)
ALT: 19 IU/L (ref 0–32)
AST: 16 IU/L (ref 0–40)
BILIRUBIN TOTAL: 0.3 mg/dL (ref 0.0–1.2)
BUN / CREAT RATIO: 16 (ref 9–23)
BUN: 10 mg/dL (ref 6–20)
CHLORIDE: 101 mmol/L (ref 96–106)
CO2: 25 mmol/L (ref 18–29)
Calcium: 9.4 mg/dL (ref 8.7–10.2)
Creatinine, Ser: 0.62 mg/dL (ref 0.57–1.00)
GFR calc non Af Amer: 129 mL/min/{1.73_m2} (ref >59)
GFR, EST AFRICAN AMERICAN: 149 mL/min/{1.73_m2} (ref >59)
GLUCOSE: 86 mg/dL (ref 65–99)
Globulin, Total: 2.5 g/dL (ref 1.5–4.5)
POTASSIUM: 4.3 mmol/L (ref 3.5–5.2)
Sodium: 141 mmol/L (ref 134–144)
Total Protein: 6.8 g/dL (ref 6.0–8.5)

## 2016-02-01 LAB — CBC
Hematocrit: 38.8 % (ref 34.0–46.6)
Hemoglobin: 13.3 g/dL (ref 11.1–15.9)
MCH: 31.1 pg (ref 26.6–33.0)
MCHC: 34.3 g/dL (ref 31.5–35.7)
MCV: 91 fL (ref 79–97)
PLATELETS: 267 10*3/uL (ref 150–379)
RBC: 4.28 x10E6/uL (ref 3.77–5.28)
RDW: 12.1 % — ABNORMAL LOW (ref 12.3–15.4)
WBC: 5.4 10*3/uL (ref 3.4–10.8)

## 2016-02-01 LAB — TESTOSTERONE, FREE, TOTAL, SHBG
Sex Hormone Binding: 36.6 nmol/L (ref 24.6–122.0)
TESTOSTERONE FREE: 3 pg/mL (ref 0.0–4.2)
TESTOSTERONE: 46 ng/dL (ref 8–48)

## 2016-02-01 LAB — VITAMIN D 25 HYDROXY (VIT D DEFICIENCY, FRACTURES): Vit D, 25-Hydroxy: 22.5 ng/mL — ABNORMAL LOW (ref 30.0–100.0)

## 2016-02-01 LAB — DHEA-SULFATE: DHEA SO4: 265.7 ug/dL (ref 110.0–431.7)

## 2016-02-01 LAB — INSULIN, RANDOM: INSULIN: 11.5 u[IU]/mL (ref 2.6–24.9)

## 2016-02-01 LAB — HEMOGLOBIN A1C
Est. average glucose Bld gHb Est-mCnc: 91 mg/dL
HEMOGLOBIN A1C: 4.8 % (ref 4.8–5.6)

## 2016-02-01 LAB — FERRITIN: Ferritin: 115 ng/mL (ref 15–150)

## 2016-02-01 LAB — THYROID PANEL WITH TSH
Free Thyroxine Index: 1.9 (ref 1.2–4.9)
T3 UPTAKE RATIO: 27 % (ref 24–39)
T4, Total: 7 ug/dL (ref 4.5–12.0)
TSH: 1.34 u[IU]/mL (ref 0.450–4.500)

## 2016-02-01 LAB — ESTRADIOL: Estradiol: 72.9 pg/mL

## 2016-02-01 LAB — VITAMIN B12: Vitamin B-12: 486 pg/mL (ref 232–1245)

## 2016-02-01 LAB — FSH/LH
FSH: 6.1 m[IU]/mL
LH: 9.6 m[IU]/mL

## 2016-02-01 LAB — PROGESTERONE

## 2016-02-01 LAB — IRON: Iron: 53 ug/dL (ref 27–159)

## 2016-02-01 LAB — PROLACTIN: PROLACTIN: 12.5 ng/mL (ref 4.8–23.3)

## 2016-02-01 LAB — BETA HCG QUANT (REF LAB): hCG Quant: <1 m[IU]/mL

## 2016-02-05 ENCOUNTER — Other Ambulatory Visit: Payer: Self-pay | Admitting: Obstetrics and Gynecology

## 2016-02-05 DIAGNOSIS — E559 Vitamin D deficiency, unspecified: Secondary | ICD-10-CM

## 2016-02-05 MED ORDER — VITAMIN D (ERGOCALCIFEROL) 1.25 MG (50000 UNIT) PO CAPS: 50000.0000 [IU] | ORAL_CAPSULE | ORAL | 1 refills | Status: DC

## 2016-02-06 ENCOUNTER — Telehealth: Payer: Self-pay | Admitting: *Deleted

## 2016-02-06 NOTE — Telephone Encounter (Signed)
Mailed info on pt

## 2016-02-06 NOTE — Telephone Encounter (Signed)
-----   Message from Purcell NailsMelody N Shambley, [place] IslandCNM sent at 02/05/2016  9:04 AM EST ----- Please give info on Vit D def

## 2016-02-15 ENCOUNTER — Encounter: Payer: Self-pay | Admitting: Obstetrics and Gynecology

## 2016-02-15 ENCOUNTER — Ambulatory Visit (INDEPENDENT_AMBULATORY_CARE_PROVIDER_SITE_OTHER): Payer: BC Managed Care – PPO | Admitting: Obstetrics and Gynecology

## 2016-02-15 VITALS — BP 100/74 | HR 89 | Ht 61.0 in | Wt 137.0 lb

## 2016-02-15 DIAGNOSIS — N912 Amenorrhea, unspecified: Secondary | ICD-10-CM

## 2016-02-15 LAB — POCT URINE PREGNANCY: Preg Test, Ur: NEGATIVE

## 2016-02-15 MED ORDER — MEDROXYPROGESTERONE ACETATE 10 MG PO TABS: 10.0000 mg | ORAL_TABLET | Freq: Every day | ORAL | 10 refills | Status: DC

## 2016-02-15 NOTE — Progress Notes (Signed)
Subjective:     Patient ID: Theresa Knight, female   DOB: [birth year], 21 y.o.   MRN: [id number]  HPI Here to review labs and ultrasound from last month. Denies any menses since July, but has noticed nipple tenderness the last week. Is in nursing school and desires pregnancy but may wait until the fall to actively try.  Review of Systems Negative except stated in HPI    Objective:   Physical Exam A&O x4 Well groomed female in no distress Blood pressure 100/74, pulse 89, height 5\' 1"  (1.549 m), weight 137 lb (62.1 kg), last menstrual period 08/11/2015. UPT negative    Assessment:     Secondary amenorrhea Anovulation     Plan:     Discussed options of expectant management vs. Clomid therapy. Desires waiting until the fall for clomid therapy. Will do provera challenge q10053m as needed.  >50% of 15 minute visit spent in counseling. Sumayah Bearse GueydanShambley, CNM

## 2016-03-12 ENCOUNTER — Other Ambulatory Visit: Payer: Self-pay | Admitting: *Deleted

## 2016-03-12 ENCOUNTER — Telehealth: Payer: Self-pay | Admitting: Obstetrics and Gynecology

## 2016-03-12 DIAGNOSIS — N926 Irregular menstruation, unspecified: Secondary | ICD-10-CM

## 2016-03-12 NOTE — Telephone Encounter (Signed)
error 

## 2016-03-12 NOTE — Telephone Encounter (Signed)
Pt is coming in for beta labs 03/13/16

## 2016-03-12 NOTE — Telephone Encounter (Signed)
5 DAYS AGO POSITIVE PREG TEST, NOW HEAVY CRAMPING AND HEAVY BLEEDING. WHAT DOES SHE NEED TO DO, SHE WONDERS IF IT WAS A FALSE POSITIVE BC ITS NEGATIVE NOW

## 2016-03-13 ENCOUNTER — Other Ambulatory Visit: Payer: BC Managed Care – PPO

## 2016-03-13 DIAGNOSIS — N926 Irregular menstruation, unspecified: Secondary | ICD-10-CM

## 2016-03-14 ENCOUNTER — Encounter: Payer: Self-pay | Admitting: Obstetrics and Gynecology

## 2016-03-14 LAB — BETA HCG QUANT (REF LAB): hCG Quant: <1 m[IU]/mL

## 2016-05-07 ENCOUNTER — Telehealth: Payer: Self-pay | Admitting: Obstetrics and Gynecology

## 2016-05-07 NOTE — Telephone Encounter (Signed)
Patient calling with sharpe stabbing pain in right side and some in left side X 2 weeks. She usually sees Melody but wants to see Dr D for evaluation of symptoms related to endometriosis  Stage 3. Please call [phone]

## 2016-05-08 ENCOUNTER — Encounter: Payer: Self-pay | Admitting: Obstetrics and Gynecology

## 2016-05-14 ENCOUNTER — Ambulatory Visit (INDEPENDENT_AMBULATORY_CARE_PROVIDER_SITE_OTHER): Payer: BC Managed Care – PPO | Admitting: Obstetrics and Gynecology

## 2016-05-14 ENCOUNTER — Encounter: Payer: Self-pay | Admitting: Obstetrics and Gynecology

## 2016-05-14 VITALS — BP 112/81 | HR 85 | Ht 61.0 in | Wt 131.6 lb

## 2016-05-14 DIAGNOSIS — N941 Unspecified dyspareunia: Secondary | ICD-10-CM | POA: Insufficient documentation

## 2016-05-14 DIAGNOSIS — N809 Endometriosis, unspecified: Secondary | ICD-10-CM | POA: Insufficient documentation

## 2016-05-14 DIAGNOSIS — N946 Dysmenorrhea, unspecified: Secondary | ICD-10-CM | POA: Diagnosis not present

## 2016-05-14 DIAGNOSIS — E282 Polycystic ovarian syndrome: Secondary | ICD-10-CM | POA: Insufficient documentation

## 2016-05-14 DIAGNOSIS — Z842 Family history of other diseases of the genitourinary system: Secondary | ICD-10-CM

## 2016-05-14 DIAGNOSIS — N926 Irregular menstruation, unspecified: Secondary | ICD-10-CM

## 2016-05-14 HISTORY — DX: Polycystic ovarian syndrome: E28.2

## 2016-05-14 MED ORDER — NORETHINDRONE ACETATE 5 MG PO TABS: 5.0000 mg | ORAL_TABLET | Freq: Three times a day (TID) | ORAL | 2 refills | Status: DC

## 2016-05-14 NOTE — Progress Notes (Signed)
GYN ENCOUNTER NOTE  Subjective:       Theresa Knight is a 21 y.o. G0P0000 female is here for gynecologic evaluation of the following issues:  1. Symptomatic endometriosis 2. Irregular menstrual cycles  Patient was diagnosed with endometriosis in September 2016 through laparoscopy with biopsies which confirmed endometriosis in the cul-de-sac. Comments from the surgery was notable for the patient having involvement in the cul-de-sac and right adnexal region. She was placed on norethindrone 1 tablet daily following her surgery up until February 2017. Between March 2017 and the present time she has been attempting to conceive; during this time she only had 2 menstrual cycles. Significant dysmenorrhea was noted.  Patient reports a long history of irregular menses, infrequent, approximately 2 per year. Patient does note increased soreness of skin with acne without Significant hair growth.  GYN history: Menarche age 14 Long history of irregular menstrual cycles Intervals average approximately 6 months Duration of flow 30-45 days and heavy Dysmenorrhea moderate to severe requiring naproxen or ibuprofen for relief; patient has missed several days of school during cycles because of the severity of her pain and bleeding. She has never used narcotics for pain relief Dysmenorrhea treatments included Nexplanon (ineffective), Mirena IUD (expelled after 1 week); the NuvaRing seemed to work reasonably well with regulation of her bleeding but not her dysmenorrhea. Patient does experience deep thrusting dyspareunia and can only have intercourse and several positions. Family history positive for endometriosis- mom   Obstetric History OB History  Gravida Para Term Preterm AB Living  0 0 0 0 0 0  SAB TAB Ectopic Multiple Live Births  0 0 0 0 0        Past Medical History:  Diagnosis Date  . Anemia   . Endometriosis   . GERD (gastroesophageal reflux disease)   . 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 Vitamin D deficiency     Past Surgical History:  Procedure Laterality Date  . CYSTOSCOPY N/A 11/10/2014   Procedure: CYSTOSCOPY;  Surgeon: Vena Austria, MD;  Location: [facility] ORS;  Service: Gynecology;  Laterality: N/A;  . KNEE ARTHROSCOPY Bilateral 2009, 2011  . LAPAROSCOPY N/A 11/10/2014   Procedure: LAPAROSCOPY DIAGNOSTIC with biopsies;  Surgeon: Vena Austria, MD;  Location: [facility] ORS;  Service: Gynecology;  Laterality: N/A;  . WISDOM TOOTH EXTRACTION      Current Outpatient Prescriptions on File Prior to Visit  Medication Sig Dispense Refill  . medroxyPROGESTERone (PROVERA) 10 MG tablet Take 1 tablet (10 mg total) by mouth daily. 7 tablet 10  . Prenatal Vit-Fe Fumarate-FA (PRENATAL MULTIVITAMIN) TABS tablet Take 1 tablet by mouth daily at 12 noon.     No current facility-administered medications on file prior to visit.     No Known Allergies  Social History   Social History  . Marital status: Married    Spouse name: N/A  . Number of children: N/A  . Years of education: N/A   Occupational History  . Not on file.   Social History Main Topics  . Smoking status: Never Smoker  . Smokeless tobacco: Never Used  . Alcohol use No  . Drug use: No  . Sexual activity: Yes    Birth control/ protection: None   Other Topics Concern  . Not on file   Social History Narrative  . No narrative on file    Family History  Problem Relation Age of Onset  . Diabetes Paternal Grandmother   . Heart disease Paternal Grandmother   .  Breast cancer Neg Hx   . Ovarian cancer Neg Hx   . Colon cancer Neg Hx     The following portions of the patient's history were reviewed and updated as appropriate: allergies, current medications, past family history, past medical history, past social history, past surgical history and problem list.  Review of Systems Review of Systems  Constitutional: Negative.   HENT: Negative.   Respiratory: Negative.   Cardiovascular: Negative.    Gastrointestinal: Positive for abdominal pain. Negative for constipation, diarrhea, nausea and vomiting.       No bowel changes noted perimenstrually  Genitourinary: Negative for dysuria, flank pain, frequency and urgency.       Irregular menstrual cycles, heavy Moderate to severe dysmenorrhea Deep thrusting dyspareunia compromises intercourse positions  Musculoskeletal: Negative.   Skin: Negative.   Neurological: Negative.   Endo/Heme/Allergies: Negative.   Psychiatric/Behavioral: Negative.      Objective:   BP 112/81   Pulse 85   Ht  (1.549 m)   Wt 131 lb 9.6 oz (59.7 kg)   LMP 02/26/2016 (Exact Date)   BMI 24.87 kg/m  CONSTITUTIONAL: Well-developed, well-nourished female in no acute distress.  HENT:  Normocephalic, atraumatic.  NECK: Normal range of motion, supple, no masses.  Normal thyroid.  SKIN: Skin is warm and dry. No rash noted. Not diaphoretic. No erythema. No pallor. NEUROLGIC: Alert and oriented to person, place, and time. PSYCHIATRIC: Normal mood and affect. Normal behavior. Normal judgment and thought content. CARDIOVASCULAR:Not Examined RESPIRATORY: Not Examined BREASTS: Not Examined BACK: No CVA tenderness; no spinal tenderness ABDOMEN: Soft, non distended; Non tender.  No Organomegaly. PELVIC:  External Genitalia: Normal  BUS: Normal  Vagina: Normal  Cervix: Normal; 3/4 cervical motion tenderness with movement left or right or anterior/posterior; no lesions  Uterus: Mid plane to retroverted, Normal size, shape,consistency, mobile, 3/4 tender  Adnexa: Normal; nonpalpable and nontender  RV: Normal external exam  Bladder: Nontender MUSCULOSKELETAL: Normal range of motion. No tenderness.  No cyanosis, clubbing, or edema.     Assessment:   1. Endometriosis,Verified by laparoscopy biopsies  2. Irregular menses, possibly consistent with PCO  3. Dyspareunia in female, deep thrusting, limiting position  4. Dysmenorrhea, moderate to severe  5.  PCO, suspected     Plan:   1. Hold off on conception until after nursing school graduation next year 2. Suppress endometriosis through using norethindrone acetate 5 mg 3 times a day 3. Continue taking multivitamin with 0.4 mg of folic acid daily 4. Continue with healthy eating and exercise 5. Continue using naproxen or ibuprofen for dysmenorrhea/dyspareunia 6. Recommend Clomid ovulation induction following nursing school graduation when conception is desired  A total of 25 minutes were spent face-to-face with the patient during this encounter and over half of that time involved counseling and coordination of care.  Herold Harms, MD  Note: This dictation was prepared with Dragon dictation along with smaller phrase technology. Any transcriptional errors that result from this process are unintentional.

## 2016-05-14 NOTE — Patient Instructions (Signed)
1. Begin norethindrone 15 mg daily 2. Continue taking multivitamin with 0.4 mg of folic acid 3. Return in 3 months for follow-up on endometriosis 4. Following nursing school regulation, consider attempting conception with the assistance of Clomid

## 2016-05-15 ENCOUNTER — Encounter: Payer: BC Managed Care – PPO | Admitting: Obstetrics and Gynecology

## 2016-07-28 ENCOUNTER — Encounter: Payer: Self-pay | Admitting: Obstetrics and Gynecology

## 2016-07-30 ENCOUNTER — Other Ambulatory Visit: Payer: Self-pay

## 2016-07-30 MED ORDER — NORETHINDRONE ACETATE 5 MG PO TABS: 10.0000 mg | ORAL_TABLET | Freq: Every day | ORAL | 3 refills | Status: DC

## 2016-08-18 ENCOUNTER — Other Ambulatory Visit: Payer: Self-pay | Admitting: Obstetrics and Gynecology

## 2016-10-26 ENCOUNTER — Encounter: Payer: Self-pay | Admitting: Obstetrics and Gynecology

## 2016-12-26 ENCOUNTER — Ambulatory Visit: Payer: BC Managed Care – PPO | Admitting: Certified Nurse Midwife

## 2016-12-26 ENCOUNTER — Encounter: Payer: Self-pay | Admitting: Certified Nurse Midwife

## 2016-12-26 VITALS — BP 111/72 | HR 87 | Ht 61.0 in | Wt 127.9 lb

## 2016-12-26 DIAGNOSIS — N912 Amenorrhea, unspecified: Secondary | ICD-10-CM

## 2016-12-26 LAB — POCT URINE PREGNANCY: PREG TEST UR: POSITIVE — AB

## 2016-12-26 MED ORDER — DOXYLAMINE-PYRIDOXINE 10-10 MG PO TBEC: 10.0000 mg | DELAYED_RELEASE_TABLET | Freq: Every day | ORAL | 1 refills | Status: DC

## 2016-12-26 NOTE — Patient Instructions (Signed)

## 2016-12-26 NOTE — Progress Notes (Signed)
Subjective:    Theresa Knight is a 21 y.o. female who presents for evaluation of amenorrhea. She believes she could be pregnant. Pregnancy is desired. Sexual Activity: single partner, contraception: none. Current symptoms also include: nausea. Last period was normal.   No LMP recorded. Patient is not currently having periods (Reason: Other). The following portions of the patient's history were reviewed and updated as appropriate: allergies, current medications, past family history, past medical history, past social history, past surgical history and problem list.  Review of Systems Constitutional: negative Eyes: negative Ears, nose, mouth, throat, and face: negative Respiratory: negative Cardiovascular: negative Gastrointestinal: negative Genitourinary:negative Integument/breast: negative Hematologic/lymphatic: negative Musculoskeletal:negative Neurological: negative Behavioral/Psych: negative Endocrine: negative Allergic/Immunologic: negative     Objective:    There were no vitals taken for this visit. General: alert, cooperative, appears stated age, no distress and no acute distress    Lab Review Urine HCG: positive    Assessment:    Absence of menstruation.     Plan:   Positive: EDC: 09/02/17. Briefly discussed pre-natal care options. Pregnancy, Childbirth and the Newborn book given. Encouraged well-balanced diet, plenty of rest when needed, pre-natal vitamins daily and walking for exercise. Discussed self-help for nausea, avoiding OTC medications until consulting provider or pharmacist, other than Tylenol as needed, minimal caffeine (1-2 cups daily) and avoiding alcohol. She will schedule ultrasound for dating in 3 wks. Nurse visit @ 10 wks, NOB physical exam @ 12 wks.   Doreene BurkeAnnie Briel Gallicchio, CNM

## 2017-01-16 ENCOUNTER — Ambulatory Visit (INDEPENDENT_AMBULATORY_CARE_PROVIDER_SITE_OTHER): Payer: BC Managed Care – PPO

## 2017-01-16 DIAGNOSIS — N912 Amenorrhea, unspecified: Secondary | ICD-10-CM | POA: Diagnosis not present

## 2017-01-27 ENCOUNTER — Encounter: Payer: Self-pay | Admitting: Certified Nurse Midwife

## 2017-02-06 ENCOUNTER — Ambulatory Visit: Payer: BC Managed Care – PPO | Admitting: Certified Nurse Midwife

## 2017-02-06 VITALS — BP 118/70 | HR 88 | Ht 61.0 in | Wt 124.0 lb

## 2017-02-06 DIAGNOSIS — Z3A10 10 weeks gestation of pregnancy: Secondary | ICD-10-CM

## 2017-02-06 NOTE — Progress Notes (Signed)
Peggye FormSavannah L Pollick presents for NOB nurse interview visit. Pregnancy confirmation done here at [facility].   G- 1.  P-    . Pregnancy education material explained and given. _No__ cats in the home. NOB labs ordered. HIV labs and Drug screen were explained optional and she did not decline. Drug screen ordered/ PNV encouraged. Genetic screening options discussed. Genetic testing will discuss with provider.To follow up with provider in _2_ weeks for NOB physical.  All questions answered.

## 2017-02-06 NOTE — Addendum Note (Signed)
Addended by: Rosine BeatLONTZ, Tanis Hensarling L on: 02/06/2017 04:15 PM   Modules accepted: Orders

## 2017-02-06 NOTE — Patient Instructions (Signed)
First Trimester of Pregnancy The first trimester of pregnancy is from week 1 until the end of week 13 (months 1 through 3). During this time, your baby will begin to develop inside you. At 6-8 weeks, the eyes and face are formed, and the heartbeat can be seen on ultrasound. At the end of 12 weeks, all the baby's organs are formed. Prenatal care is all the medical care you receive before the birth of your baby. Make sure you get good prenatal care and follow all of your doctor's instructions. Follow these instructions at home: Medicines  Take over-the-counter and prescription medicines only as told by your doctor. Some medicines are safe and some medicines are not safe during pregnancy.  Take a prenatal vitamin that contains at least 600 micrograms (mcg) of folic acid.  If you have trouble pooping (constipation), take medicine that will make your stool soft (stool softener) if your doctor approves. Eating and drinking  Eat regular, healthy meals.  Your doctor will tell you the amount of weight gain that is right for you.  Avoid raw meat and uncooked cheese.  If you feel sick to your stomach (nauseous) or throw up (vomit): ? Eat 4 or 5 small meals a day instead of 3 large meals. ? Try eating a few soda crackers. ? Drink liquids between meals instead of during meals.  To prevent constipation: ? Eat foods that are high in fiber, like fresh fruits and vegetables, whole grains, and beans. ? Drink enough fluids to keep your pee (urine) clear or pale yellow. Activity  Exercise only as told by your doctor. Stop exercising if you have cramps or pain in your lower belly (abdomen) or low back.  Do not exercise if it is too hot, too humid, or if you are in a place of great height (high altitude).  Try to avoid standing for long periods of time. Move your legs often if you must stand in one place for a long time.  Avoid heavy lifting.  Wear low-heeled shoes. Sit and stand up straight.  You  can have sex unless your doctor tells you not to. Relieving pain and discomfort  Wear a good support bra if your breasts are sore.  Take warm water baths (sitz baths) to soothe pain or discomfort caused by hemorrhoids. Use hemorrhoid cream if your doctor says it is okay.  Rest with your legs raised if you have leg cramps or low back pain.  If you have puffy, bulging veins (varicose veins) in your legs: ? Wear support hose or compression stockings as told by your doctor. ? Raise (elevate) your feet for 15 minutes, 3-4 times a day. ? Limit salt in your food. Prenatal care  Schedule your prenatal visits by the twelfth week of pregnancy.  Write down your questions. Take them to your prenatal visits.  Keep all your prenatal visits as told by your doctor. This is important. Safety  Wear your seat belt at all times when driving.  Make a list of emergency phone numbers. The list should include numbers for family, friends, the hospital, and police and fire departments. General instructions  Ask your doctor for a referral to a local prenatal class. Begin classes no later than at the start of month 6 of your pregnancy.  Ask for help if you need counseling or if you need help with nutrition. Your doctor can give you advice or tell you where to go for help.  Do not use hot tubs, steam rooms, or   saunas.  Do not douche or use tampons or scented sanitary pads.  Do not cross your legs for long periods of time.  Avoid all herbs and alcohol. Avoid drugs that are not approved by your doctor.  Do not use any tobacco products, including cigarettes, chewing tobacco, and electronic cigarettes. If you need help quitting, ask your doctor. You may get counseling or other support to help you quit.  Avoid cat litter boxes and soil used by cats. These carry germs that can cause birth defects in the baby and can cause a loss of your baby (miscarriage) or stillbirth.  Visit your dentist. At home, brush  your teeth with a soft toothbrush. Be gentle when you floss. Contact a doctor if:  You are dizzy.  You have mild cramps or pressure in your lower belly.  You have a nagging pain in your belly area.  You continue to feel sick to your stomach, you throw up, or you have watery poop (diarrhea).  You have a bad smelling fluid coming from your vagina.  You have pain when you pee (urinate).  You have increased puffiness (swelling) in your face, hands, legs, or ankles. Get help right away if:  You have a fever.  You are leaking fluid from your vagina.  You have spotting or bleeding from your vagina.  You have very bad belly cramping or pain.  You gain or lose weight rapidly.  You throw up blood. It may look like coffee grounds.  You are around people who have German measles, fifth disease, or chickenpox.  You have a very bad headache.  You have shortness of breath.  You have any kind of trauma, such as from a fall or a car accident. Summary  The first trimester of pregnancy is from week 1 until the end of week 13 (months 1 through 3).  To take care of yourself and your unborn baby, you will need to eat healthy meals, take medicines only if your doctor tells you to do so, and do activities that are safe for you and your baby.  Keep all follow-up visits as told by your doctor. This is important as your doctor will have to ensure that your baby is healthy and growing well. This information is not intended to replace advice given to you by your health care provider. Make sure you discuss any questions you have with your health care provider. Document Released: 07/16/2007 Document Revised: 02/05/2016 Document Reviewed: 02/05/2016 Elsevier Interactive Patient Education  2017 Elsevier Inc[employer]  

## 2017-02-07 LAB — CBC WITH DIFFERENTIAL/PLATELET
BASOS ABS: 0 10*3/uL (ref 0.0–0.2)
BASOS: 0 %
EOS (ABSOLUTE): 0.1 10*3/uL (ref 0.0–0.4)
Eos: 1 %
HEMOGLOBIN: 13 g/dL (ref 11.1–15.9)
Hematocrit: 37.5 % (ref 34.0–46.6)
IMMATURE GRANS (ABS): 0 10*3/uL (ref 0.0–0.1)
IMMATURE GRANULOCYTES: 0 %
LYMPHS: 30 %
Lymphocytes Absolute: 2.3 10*3/uL (ref 0.7–3.1)
MCH: 31.4 pg (ref 26.6–33.0)
MCHC: 34.7 g/dL (ref 31.5–35.7)
MCV: 91 fL (ref 79–97)
MONOCYTES: 7 %
Monocytes Absolute: 0.5 10*3/uL (ref 0.1–0.9)
NEUTROS ABS: 4.6 10*3/uL (ref 1.4–7.0)
NEUTROS PCT: 62 %
PLATELETS: 263 10*3/uL (ref 150–379)
RBC: 4.14 x10E6/uL (ref 3.77–5.28)
RDW: 12.7 % (ref 12.3–15.4)
WBC: 7.6 10*3/uL (ref 3.4–10.8)

## 2017-02-07 LAB — URINALYSIS, ROUTINE W REFLEX MICROSCOPIC
BILIRUBIN UA: NEGATIVE
Glucose, UA: NEGATIVE
Ketones, UA: NEGATIVE
Leukocytes, UA: NEGATIVE
Nitrite, UA: NEGATIVE
PH UA: 8.5 — AB (ref 5.0–7.5)
PROTEIN UA: NEGATIVE
RBC UA: NEGATIVE
Specific Gravity, UA: 1.02 (ref 1.005–1.030)
UUROB: 0.2 mg/dL (ref 0.2–1.0)

## 2017-02-07 LAB — ABO AND RH: RH TYPE: POSITIVE

## 2017-02-07 LAB — GC/CHLAMYDIA PROBE AMP
Chlamydia trachomatis, NAA: NEGATIVE
Neisseria gonorrhoeae by PCR: NEGATIVE

## 2017-02-07 LAB — ANTIBODY SCREEN: Antibody Screen: NEGATIVE

## 2017-02-07 LAB — RUBELLA SCREEN: Rubella Antibodies, IGG: 1.19 index (ref >0.99)

## 2017-02-07 LAB — HEPATITIS B SURFACE ANTIGEN: HEP B S AG: NEGATIVE

## 2017-02-07 LAB — VARICELLA ZOSTER ANTIBODY, IGG: VARICELLA: 831 {index} (ref >165)

## 2017-02-07 LAB — HIV ANTIBODY (ROUTINE TESTING W REFLEX): HIV Screen 4th Generation wRfx: NONREACTIVE

## 2017-02-07 LAB — RPR: RPR: NONREACTIVE

## 2017-02-08 LAB — URINE CULTURE

## 2017-02-09 LAB — MONITOR DRUG PROFILE 14(MW)
Amphetamine Scrn, Ur: NEGATIVE ng/mL
BARBITURATE SCREEN URINE: NEGATIVE ng/mL
BENZODIAZEPINE SCREEN, URINE: NEGATIVE ng/mL
BUPRENORPHINE, URINE: NEGATIVE ng/mL
CANNABINOIDS UR QL SCN: NEGATIVE ng/mL
Cocaine (Metab) Scrn, Ur: NEGATIVE ng/mL
Creatinine(Crt), U: 141.7 mg/dL (ref 20.0–300.0)
FENTANYL, URINE: NEGATIVE pg/mL
Meperidine Screen, Urine: NEGATIVE ng/mL
Methadone Screen, Urine: NEGATIVE ng/mL
OPIATE SCREEN URINE: NEGATIVE ng/mL
OXYCODONE+OXYMORPHONE UR QL SCN: NEGATIVE ng/mL
PH UR, DRUG SCRN: 8.4 (ref 4.5–8.9)
PHENCYCLIDINE QUANTITATIVE URINE: NEGATIVE ng/mL
Propoxyphene Scrn, Ur: NEGATIVE ng/mL
SPECIFIC GRAVITY: 1.016
Tramadol Screen, Urine: NEGATIVE ng/mL

## 2017-02-10 NOTE — L&D Delivery Note (Signed)
Delivery Note   Theresa Knight is a 22 y.o. G1P0000 at 4820w2d Estimated Date of Delivery: 09/02/17  PRE-OPERATIVE DIAGNOSIS:  1) 2220w2d pregnancy.    POST-OPERATIVE DIAGNOSIS:  1) 7320w2d pregnancy s/p Vaginal, Spontaneous     Delivery Type: Vaginal, Spontaneous    Delivery Anesthesia:   None  Labor Complications:   None    ESTIMATED BLOOD LOSS: 300 ml    FINDINGS:   1) female infant, Apgar scores of 8 at 1 minute and 9 at 5 minutes and a birthweight pending. Infant remains skin to skin.   2) Nuchal cord: no  SPECIMENS:   PLACENTA:   Appearance: Intact , 3 vessel cord, cord blood sample   Removal: Spontaneous      Disposition:    Held per protocol then discarded  DISPOSITION:  Infant to left in stable condition in the delivery room, with L&D personnel and mother,  NARRATIVE SUMMARY: Labor course:  Ms. Theresa FormSavannah L Carolan is a G1P0000 at 3020w2d who presented for labor management.  She progressed well in labor without pitocin.  She received no anesthesia and proceeded to complete dilation. She evidenced good maternal expulsive effort during the second stage. She went on to deliver a viable female infant "Micah" The head delivered OA then rotated ROT for delivery of shoulder. The infant was placed on the abdomen with tactile stimulation and spontaneous cry. The placenta delivered without problems and was noted to be complete. A perineal and vaginal examination was performed. Lacerations:   Small abrasion on left labia , hemostatic not in need of repair.The patient tolerated this well.  Doreene Burkennie Claira Jeter, CNM  08/28/2017 1:08 AM

## 2017-02-11 ENCOUNTER — Encounter: Payer: Self-pay | Admitting: Certified Nurse Midwife

## 2017-02-20 ENCOUNTER — Ambulatory Visit (INDEPENDENT_AMBULATORY_CARE_PROVIDER_SITE_OTHER): Payer: Managed Care, Other (non HMO) | Admitting: Certified Nurse Midwife

## 2017-02-20 ENCOUNTER — Encounter: Payer: Self-pay | Admitting: Certified Nurse Midwife

## 2017-02-20 VITALS — BP 107/73 | HR 82 | Wt 122.5 lb

## 2017-02-20 DIAGNOSIS — Z3491 Encounter for supervision of normal pregnancy, unspecified, first trimester: Secondary | ICD-10-CM

## 2017-02-20 LAB — POCT URINALYSIS DIPSTICK
Bilirubin, UA: NEGATIVE
Glucose, UA: NEGATIVE
Ketones, UA: NEGATIVE
LEUKOCYTES UA: NEGATIVE
NITRITE UA: NEGATIVE
Protein, UA: NEGATIVE
RBC UA: NEGATIVE
SPEC GRAV UA: 1.01 (ref 1.010–1.025)
Urobilinogen, UA: 0.2 E.U./dL
pH, UA: 7 (ref 5.0–8.0)

## 2017-02-20 MED ORDER — DOXYLAMINE-PYRIDOXINE 10-10 MG PO TBEC: 10.0000 mg | DELAYED_RELEASE_TABLET | Freq: Every day | ORAL | 1 refills | Status: DC

## 2017-02-20 NOTE — Progress Notes (Signed)
ROB- Patient feels well with no complaints.  

## 2017-02-20 NOTE — Patient Instructions (Signed)

## 2017-02-20 NOTE — Progress Notes (Signed)
NEW OB HISTORY AND PHYSICAL  SUBJECTIVE:       Theresa Knight is a 22 y.o. G45P0000 female, Patient's last menstrual period was 11/26/2016., Estimated Date of Delivery: 09/02/17, [redacted]w[redacted]d, presents today for establishment of Prenatal Care. She has  Complaints that she was at clinical one morning and checked a fasting glucose of 179 then repeated it 154. She denies any history of diabetes, no immediate family members have diabetes. She also complains of nausea that is improved with Diclegis.       Gynecologic History Patient's last menstrual period was 11/26/2016. Normal Contraception: none Last Pap: 01/29/2016. Results were: normal  Obstetric History OB History  Gravida Para Term Preterm AB Living  1 0 0 0 0 0  SAB TAB Ectopic Multiple Live Births  0 0 0 0 0    # Outcome Date GA Lbr Len/2nd Weight Sex Delivery Anes PTL Lv  1 Current               Past Medical History:  Diagnosis Date  . Anemia   . Endometriosis   . GERD (gastroesophageal reflux disease)   . 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    Past Surgical History:  Procedure Laterality Date  . CYSTOSCOPY N/A 11/10/2014   Procedure: CYSTOSCOPY;  Surgeon: Vena Austria, MD;  Location: [facility] ORS;  Service: Gynecology;  Laterality: N/A;  . KNEE ARTHROSCOPY Bilateral 2009, 2011  . LAPAROSCOPY N/A 11/10/2014   Procedure: LAPAROSCOPY DIAGNOSTIC with biopsies;  Surgeon: Vena Austria, MD;  Location: [facility] ORS;  Service: Gynecology;  Laterality: N/A;  . WISDOM TOOTH EXTRACTION      Current Outpatient Medications on File Prior to Visit  Medication Sig Dispense Refill  . Doxylamine-Pyridoxine 10-10 MG TBEC Take 10 mg daily by mouth. 60 tablet 1  . Prenatal Vit-Fe Fumarate-FA (PRENATAL MULTIVITAMIN) TABS tablet Take 1 tablet daily at 12 noon by mouth.     No current  facility-administered medications on file prior to visit.     No Known Allergies  Social History   Socioeconomic History  . Marital status: Married    Spouse name: Not on file  . Number of children: Not on file  . Years of education: Not on file  . Highest education level: Not on file  Social Needs  . Financial resource strain: Not on file  . Food insecurity - worry: Not on file  . Food insecurity - inability: Not on file  . Transportation needs - medical: Not on file  . Transportation needs - non-medical: Not on file  Occupational History  . Not on file  Tobacco Use  . Smoking status: Never Smoker  . Smokeless tobacco: Never Used  Substance and Sexual Activity  . Alcohol use: No  . Drug use: No  . Sexual activity: Yes    Birth control/protection: None  Other Topics Concern  . Not on file  Social History Narrative  . Not on file    Family History  Problem Relation Age of Onset  . Diabetes Paternal Grandmother   . Heart disease Paternal Grandmother   . Breast cancer Neg Hx   . Ovarian cancer Neg Hx   . Colon cancer Neg Hx     The following portions of the patient's history were reviewed and updated as appropriate: allergies, current medications, past  OB history, past medical history, past surgical history, past family history, past social history, and problem list.   OBJECTIVE: Initial Physical Exam (New OB)  GENERAL APPEARANCE: alert, well appearing, in no apparent distress, oriented to person, place and time HEAD: normocephalic, atraumatic MOUTH: mucous membranes moist, pharynx normal without lesions THYROID: no thyromegaly or masses present BREASTS: no masses noted, no significant tenderness, no palpable axillary nodes, no skin changes LUNGS: clear to auscultation, no wheezes, rales or rhonchi, symmetric air entry HEART: regular rate and rhythm, no murmurs ABDOMEN: soft, nontender, nondistended, no abnormal masses, no epigastric pain and fundus soft,  nontender 12 weeks size EXTREMITIES: no redness or tenderness in the calves or thighs, no edema, no limitation in range of motion, intact peripheral pulses SKIN: normal coloration and turgor, no rashes LYMPH NODES: no adenopathy palpable NEUROLOGIC: alert, oriented, normal speech, no focal findings or movement disorder noted  PELVIC EXAM EXTERNAL GENITALIA: normal appearing vulva with no masses, tenderness or lesions VAGINA: no abnormal discharge or lesions CERVIX: no lesions or cervical motion tenderness UTERUS: gravid and consistent with 12 weeks ADNEXA: no masses palpable and nontender OB EXAM PELVIMETRY: appears adequate RECTUM: exam not indicated  ASSESSMENT: Normal pregnancy  PLAN: New OB counseling: The patient has been given an overview regarding routine prenatal care. Recommendations regarding diet, weight gain, and exercise in pregnancy were given. Prenatal testing, optional genetic testing, and carrier screening. She declines genetic testing. Ultrasound use in pregnancy were reviewed.  Benefits of Breast Feeding were discussed. The patient is encouraged to consider nursing her baby post partum. Given she has no risk factors indicating need for early GTT. Pt encouraged to inform if she becomes symptomatic or if she has this finding again.  Refill for diclegis today.  Follow up in 4 wks.   Doreene BurkeAnnie Theophile Harvie, CNM   Doreene BurkeAnnie Lloyde Ludlam, CNM

## 2017-02-23 ENCOUNTER — Telehealth: Payer: Self-pay | Admitting: Certified Nurse Midwife

## 2017-02-23 ENCOUNTER — Other Ambulatory Visit: Payer: Self-pay | Admitting: Certified Nurse Midwife

## 2017-02-23 DIAGNOSIS — Z87898 Personal history of other specified conditions: Secondary | ICD-10-CM

## 2017-02-23 NOTE — Progress Notes (Signed)
Pt called today and states that she had a seizure over the weekend. She has a hx of seizures not on medications. She state that she became stiff . She did not hit her abdomen or head. Her spouse was with her and helped to lower her to the ground. Referral placed for nerology .  Doreene BurkeAnnie Abra Lingenfelter, CNM

## 2017-02-23 NOTE — Telephone Encounter (Signed)
The patient called and stated that she would like to speak with Theresa Knight as soon as possible the patient had a seizure this past Saturday and has also lost 4 lbs since her last visit, The patient did not disclose any other information please advise.

## 2017-02-24 ENCOUNTER — Encounter: Payer: Self-pay | Admitting: Neurology

## 2017-03-12 ENCOUNTER — Other Ambulatory Visit: Payer: Self-pay

## 2017-03-12 ENCOUNTER — Ambulatory Visit (INDEPENDENT_AMBULATORY_CARE_PROVIDER_SITE_OTHER): Payer: BLUE CROSS/BLUE SHIELD | Admitting: Neurology

## 2017-03-12 ENCOUNTER — Ambulatory Visit (INDEPENDENT_AMBULATORY_CARE_PROVIDER_SITE_OTHER): Payer: 59 | Admitting: Neurology

## 2017-03-12 ENCOUNTER — Encounter: Payer: Self-pay | Admitting: Neurology

## 2017-03-12 VITALS — BP 92/58 | HR 80 | Ht 61.0 in | Wt 124.0 lb

## 2017-03-12 DIAGNOSIS — R55 Syncope and collapse: Secondary | ICD-10-CM | POA: Diagnosis not present

## 2017-03-12 NOTE — Patient Instructions (Signed)
1. Do 1-hour EEG today 2. Increase fluid intake. Try to get to a supine position when you start feeling weird 3. Follow-up in 7-8 months, call for any changes

## 2017-03-12 NOTE — Progress Notes (Signed)
NEUROLOGY CONSULTATION NOTE  Theresa Knight MRN: [id number] DOB: 20-Nov-1995  Referring provider: [name], CNM Primary care provider: Dr. Aram Beecham  Reason for consult:  seizure  Thank you for your kind referral of Theresa Knight for consultation of the above symptoms. Although her history is well known to you, please allow me to reiterate it for the purpose of our medical record. The patient was accompanied to the clinic by her husband who also provides collateral information. Records and images were personally reviewed where available.  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 She recalls the first episode occurred in kindergarten while she was in the bathroom, she started feeling weird and recalls falling forward, her teacher found her passed out with urinary incontinence. She had no further episodes until the 7th grade after she started her menstrual period, and started having episodes of passing out where she would lose her hearing and vision then blackout. Her husband has witnessed several of them, she would tell him she feels weird, then she starts going limp then her whole body stiffens for 1 minute with arms stretched out. When she comes to, everything is blurry and she is very hot and sweaty, no post-event confusion. She would feel very weak, sleepy, and her muscles would be sore. She has the episodes very sporadically, they tend to be around the time of her menstrual period, but she is irregular with only 2 cycles a year. She has bad menstrual cramps and heavy bleeding with her periods. She has also noticed that pain is a trigger. She did not have any episodes in 2017, then had 2 in 2018, in February 2018 she had a bad chest cold, took a hot bath, then when she stood up, she had an episode. In July 2018, she was donating plasma and had an  event. The last episode was on 02/21/17, she had not eaten breakfast and took a very hot shower, went downstairs and was whisking eggs when she told her husband she was not feeling good and then passed out. Her husband checked her pulse and noted it was low at 59. No tongue bite or incontinence. He denies any staring episodes, she denies any olfactory/gustatory hallucinations, deja vu, rising epigastric sensation, focal numbness/tingling/weakness, myoclonic jerks.  There is a strong family history of similar symptoms in her mother, 2 maternal aunts and her cousins, as well as her 108 year old sister. None of them are on seizure medications and have been evaluated by Neurology. Otherwise she had a normal birth and early development.  There is no history of febrile convulsions, CNS infections such as meningitis/encephalitis, significant traumatic brain injury, neurosurgical procedures. She denies any significant dizziness, diplopia, dysarthria/dysphagia, neck/back pain, bowel/bladder dysfunction. She is due to deliver her baby in July 2019.   She had an MRI brain with and without contrast in 2013 which was normal, images unavailable for review. She reports having an EEG at [facility] in 2013, report unavailable for review.   PAST MEDICAL HISTORY: Past Medical History:  Diagnosis Date  . Anemia   . Endometriosis   . GERD (gastroesophageal reflux disease)   . 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 Vitamin D deficiency     PAST SURGICAL HISTORY: Past Surgical History:  Procedure Laterality Date  . CYSTOSCOPY N/A 11/10/2014   Procedure: CYSTOSCOPY;  Surgeon: Vena Austria,  MD;  Location: [facility] ORS;  Service: Gynecology;  Laterality: N/A;  . KNEE ARTHROSCOPY Bilateral 2009, 2011  . LAPAROSCOPY N/A 11/10/2014   Procedure: LAPAROSCOPY DIAGNOSTIC with biopsies;  Surgeon: Vena Austria, MD;  Location: [facility] ORS;  Service: Gynecology;  Laterality: N/A;  . WISDOM TOOTH EXTRACTION       MEDICATIONS: Current Outpatient Medications on File Prior to Visit  Medication Sig Dispense Refill  . Doxylamine-Pyridoxine 10-10 MG TBEC Take 10 mg by mouth daily. 60 tablet 1  . Omega 3 1200 MG CAPS     . Prenatal Vit-Fe Fumarate-FA (PRENATAL MULTIVITAMIN) TABS tablet Take 1 tablet daily at 12 noon by mouth.     No current facility-administered medications on file prior to visit.     ALLERGIES: No Known Allergies  FAMILY HISTORY: Family History  Problem Relation Age of Onset  . Diabetes Paternal Grandmother   . Heart disease Paternal Grandmother   . Breast cancer Neg Hx   . Ovarian cancer Neg Hx   . Colon cancer Neg Hx     SOCIAL HISTORY: Social History   Socioeconomic History  . Marital status: Married    Spouse name: Not on file  . Number of children: Not on file  . Years of education: Not on file  . Highest education level: Not on file  Social Needs  . Financial resource strain: Not on file  . Food insecurity - worry: Not on file  . Food insecurity - inability: Not on file  . Transportation needs - medical: Not on file  . Transportation needs - non-medical: Not on file  Occupational History  . Not on file  Tobacco Use  . Smoking status: Never Smoker  . Smokeless tobacco: Never Used  Substance and Sexual Activity  . Alcohol use: No  . Drug use: No  . Sexual activity: Yes    Birth control/protection: None  Other Topics Concern  . Not on file  Social History Narrative   Pt lives in 2 story home with her husband and 2 dogs   Currently pregnant with her first child   Has a general education associates degree   Current nursing student    REVIEW OF SYSTEMS: Constitutional: No fevers, chills, or sweats, no generalized fatigue, change in appetite Eyes: No visual changes, double vision, eye pain Ear, nose and throat: No hearing loss, ear pain, nasal congestion, sore throat Cardiovascular: No chest pain, palpitations Respiratory:  No shortness of breath  at rest or with exertion, wheezes GastrointestinaI: No nausea, vomiting, diarrhea, abdominal pain, fecal incontinence Genitourinary:  No dysuria, urinary retention or frequency Musculoskeletal:  No neck pain, back pain Integumentary: No rash, pruritus, skin lesions Neurological: as above Psychiatric: No depression, insomnia, anxiety Endocrine: No palpitations, fatigue, diaphoresis, mood swings, change in appetite, change in weight, increased thirst Hematologic/Lymphatic:  No anemia, purpura, petechiae. Allergic/Immunologic: no itchy/runny eyes, nasal congestion, recent allergic reactions, rashes  PHYSICAL EXAM: Vitals:   03/12/17 0903  BP: (!) 92/58  Pulse: 80  SpO2: 99%   General: No acute distress Head:  Normocephalic/atraumatic Eyes: Fundoscopic exam shows bilateral sharp discs, no vessel changes, exudates, or hemorrhages Neck: supple, no paraspinal tenderness, full range of motion Back: No paraspinal tenderness Heart: regular rate and rhythm Lungs: Clear to auscultation bilaterally. Vascular: No carotid bruits. Skin/Extremities: No rash, no edema Neurological Exam: Mental status: alert and oriented to person, place, and time, no dysarthria or aphasia, Fund of knowledge is appropriate.  Recent and remote memory are intact.3/3 delayed recall. Attention  and concentration are normal.    Able to name objects and repeat phrases. Cranial nerves: CN I: not tested CN II: pupils equal, round and reactive to light, visual fields intact, fundi unremarkable. CN III, IV, VI:  full range of motion, no nystagmus, no ptosis CN V: facial sensation intact CN VII: upper and lower face symmetric CN VIII: hearing intact to finger rub CN IX, X: gag intact, uvula midline CN XI: sternocleidomastoid and trapezius muscles intact CN XII: tongue midline Bulk & Tone: normal, no fasciculations. Motor: 5/5 throughout with no pronator drift. Sensation: intact to light touch, cold, pin, vibration and  joint position sense.  No extinction to double simultaneous stimulation.  Romberg test negative Deep Tendon Reflexes: +2 throughout, no ankle clonus Plantar responses: downgoing bilaterally Cerebellar: no incoordination on finger to nose, heel to shin. No dysdiadochokinesia Gait: narrow-based and steady, able to tandem walk adequately. Tremor: none  IMPRESSION: This is a pleasant 22 year old right-handed woman with a history of recurrent episodes of loss of consciousness with brief tonic activity. She is currently [redacted] weeks pregnant. Her neurological exam is normal. There is a strong family history of similar symptoms on her mother side, none of them are on seizure medications. History is suggestive of convulsive syncope, less likely epileptic seizures. A 1-hour EEG will be done today to assess for focal abnormalities that increase risk for recurrent seizures. We discussed the likely diagnosis, increasing fluid intake, and getting herself to a supine postiion when she starts feeling the prodrome. Certainly if EEG is abnormal, then will discuss change in management if needed. Since episodes are typically triggered by pain/blood draw, there are no driving restrictions. We discussed that she should contact our office for any change in symptoms, otherwise follow-up after her delivery in around 7-8 months.   Thank you for allowing me to participate in the care of this patient. Please do not hesitate to call for any questions or concerns.   Patrcia DollyKaren Ossie Yebra, M.D.  CC: Doreene BurkeAnnie Thompson, CNM, Dr. Judithann SheenSparks

## 2017-03-13 ENCOUNTER — Telehealth: Payer: Self-pay

## 2017-03-13 NOTE — Telephone Encounter (Signed)
-----   Message from Van ClinesKaren M Aquino, MD sent at 03/13/2017 12:55 PM EST ----- Pls let her know the EEG was normal, thanks

## 2017-03-13 NOTE — Procedures (Signed)
ELECTROENCEPHALOGRAM REPORT  Date of Study: 03/12/2017  Patient's Name: Theresa Knight MRN: [id number] Date of Birth: 22-Mar-1995  Referring Provider: Dr. Patrcia DollyKaren Aquino  Clinical History: This is a 22 year old woman with recurrent episodes of loss of consciousness with body stiffening. EEG for classification.  Medications: Prenatal vitamins Doxylamine-pyridoxine  Technical Summary: A multichannel digital 1-hour EEG recording measured by the international 10-20 system with electrodes applied with paste and impedances below 5000 ohms performed in our laboratory with EKG monitoring in an awake and asleep patient.  Hyperventilation and photic stimulation were performed.  The digital EEG was referentially recorded, reformatted, and digitally filtered in a variety of bipolar and referential montages for optimal display.    Description: The patient is awake and asleep during the recording.  During maximal wakefulness, there is a symmetric, medium voltage 10 Hz posterior dominant rhythm that attenuates with eye opening.  The record is symmetric.  During drowsiness and sleep, there is an increase in theta slowing of the background.  Vertex waves and symmetric sleep spindles were seen.  Hyperventilation and photic stimulation did not elicit any abnormalities.  There were no epileptiform discharges or electrographic seizures seen.    EKG lead was unremarkable.  Impression: This 1-hour awake and asleep EEG is normal.    Clinical Correlation: A normal EEG does not exclude a clinical diagnosis of epilepsy.  If further clinical questions remain, prolonged EEG may be helpful.  Clinical correlation is advised.   Patrcia DollyKaren Aquino, M.D.

## 2017-03-13 NOTE — Telephone Encounter (Signed)
LMOM relaying message below.  

## 2017-03-20 ENCOUNTER — Ambulatory Visit (INDEPENDENT_AMBULATORY_CARE_PROVIDER_SITE_OTHER): Payer: BLUE CROSS/BLUE SHIELD | Admitting: Certified Nurse Midwife

## 2017-03-20 ENCOUNTER — Encounter: Payer: Self-pay | Admitting: Certified Nurse Midwife

## 2017-03-20 VITALS — BP 99/59 | HR 74 | Wt 123.4 lb

## 2017-03-20 DIAGNOSIS — Z3491 Encounter for supervision of normal pregnancy, unspecified, first trimester: Secondary | ICD-10-CM

## 2017-03-20 DIAGNOSIS — Z3402 Encounter for supervision of normal first pregnancy, second trimester: Secondary | ICD-10-CM

## 2017-03-20 LAB — POCT URINALYSIS DIPSTICK
Bilirubin, UA: NEGATIVE
Blood, UA: NEGATIVE
GLUCOSE UA: NEGATIVE
KETONES UA: NEGATIVE
Leukocytes, UA: NEGATIVE
Nitrite, UA: NEGATIVE
Protein, UA: NEGATIVE
Urobilinogen, UA: 0.2 E.U./dL
pH, UA: 5 (ref 5.0–8.0)

## 2017-03-20 NOTE — Progress Notes (Signed)
Pt is here for an ROB visit. Is c/o sciatic pain

## 2017-03-20 NOTE — Progress Notes (Signed)
ROB doing well. Has sciatic pain. Discussed use of chiropractor, ice, belly band , and tylenol . She verbalizes understanding and agrees to plan. Follow up 4 wks for anatomy u/s and ROB.   Doreene BurkeAnnie Amoni Knight, CNM

## 2017-03-20 NOTE — Patient Instructions (Signed)

## 2017-04-16 ENCOUNTER — Ambulatory Visit (INDEPENDENT_AMBULATORY_CARE_PROVIDER_SITE_OTHER): Payer: Managed Care, Other (non HMO) | Admitting: Certified Nurse Midwife

## 2017-04-16 ENCOUNTER — Ambulatory Visit (INDEPENDENT_AMBULATORY_CARE_PROVIDER_SITE_OTHER): Payer: Managed Care, Other (non HMO)

## 2017-04-16 VITALS — BP 103/65 | HR 76 | Wt 130.9 lb

## 2017-04-16 DIAGNOSIS — Z3402 Encounter for supervision of normal first pregnancy, second trimester: Secondary | ICD-10-CM

## 2017-04-16 DIAGNOSIS — Z3492 Encounter for supervision of normal pregnancy, unspecified, second trimester: Secondary | ICD-10-CM

## 2017-04-16 LAB — POCT URINALYSIS DIPSTICK
Bilirubin, UA: NEGATIVE
Glucose, UA: NEGATIVE
Ketones, UA: NEGATIVE
Leukocytes, UA: NEGATIVE
NITRITE UA: NEGATIVE
Protein, UA: NEGATIVE
RBC UA: NEGATIVE
SPEC GRAV UA: 1.01 (ref 1.010–1.025)
Urobilinogen, UA: 0.2 E.U./dL
pH, UA: 6.5 (ref 5.0–8.0)

## 2017-04-16 NOTE — Progress Notes (Signed)
ROB- anatomy scan done today, pt is doing well 

## 2017-04-16 NOTE — Progress Notes (Signed)
ROB-Doing well. Anatomy scan today normal, findings reviewed with patient and spouse. Discussed home treatment measures for discomforts of pregnancy. Anticipatory guidance regarding course of prenatal care. Reviewed red flag symptoms and when to call. RTC x 4 weeks for ROB or sooner if needed.   ULTRASOUND REPORT  Location: [facility] Date of Service:  04/16/2017  Indications: Anatomy Findings:  Singleton intrauterine pregnancy is visualized with FHR at 132 BPM. Biometrics give an (U/S) Gestational age of 23 2/7 weeks and an (U/S) EDD of 09/01/17; this correlates with the clinically established EDD of 09/02/17.  Fetal presentation is vertex.  EFW: 345 grams (0lb 12oz). Placenta: Anterior and grade 1. AFI: WNL subjectively.  Anatomic survey is complete and appears WNL; Gender - Female.   Right Ovary was not visualized. Left Ovary measures 1.6 x 1.4 x 1.2 cm. It is normal appearance. There is no obvious evidence of a corpus luteal cyst. Survey of the adnexa demonstrates no adnexal masses. There is no free peritoneal fluid in the cul de sac.  Impression: 1. 20 2/7 week Viable Singleton Intrauterine pregnancy by U/S. 2. (U/S) EDD is consistent with Clinically established (LMP) EDD of 09/02/17. 3. Normal Anatomy Scan  Recommendations: 1.Clinical correlation with the patient's History and Physical Exam.

## 2017-04-16 NOTE — Patient Instructions (Signed)
Common Medications Safe in Pregnancy  Acne:      Constipation:  Benzoyl Peroxide     Colace  Clindamycin      Dulcolax Suppository  Topica Erythromycin     Fibercon  Salicylic Acid      Metamucil         Miralax AVOID:        Senakot   Accutane    Cough:  Retin-A       Cough Drops  Tetracycline      Phenergan w/ Codeine if Rx  Minocycline      Robitussin (Plain & DM)  Antibiotics:     Crabs/Lice:  Ceclor       RID  Cephalosporins    AVOID:  E-Mycins      Kwell  Keflex  Macrobid/Macrodantin   Diarrhea:  Penicillin      Kao-Pectate  Zithromax      Imodium AD         PUSH FLUIDS AVOID:       Cipro     Fever:  Tetracycline      Tylenol (Regular or Extra  Minocycline       Strength)  Levaquin      Extra Strength-Do not          Exceed 8 tabs/24 hrs Caffeine:        <200mg/day (equiv. To 1 cup of coffee or  approx. 3 12 oz sodas)         Gas: Cold/Hayfever:       Gas-X  Benadryl      Mylicon  Claritin       Phazyme  **Claritin-D        Chlor-Trimeton    Headaches:  Dimetapp      ASA-Free Excedrin  Drixoral-Non-Drowsy     Cold Compress  Mucinex (Guaifenasin)     Tylenol (Regular or Extra  Sudafed/Sudafed-12 Hour     Strength)  **Sudafed PE Pseudoephedrine   Tylenol Cold & Sinus     Vicks Vapor Rub  Zyrtec  **AVOID if Problems With Blood Pressure         Heartburn: Avoid lying down for at least 1 hour after meals  Aciphex      Maalox     Rash:  Milk of Magnesia     Benadryl    Mylanta       1% Hydrocortisone Cream  Pepcid  Pepcid Complete   Sleep Aids:  Prevacid      Ambien   Prilosec       Benadryl  Rolaids       Chamomile Tea  Tums (Limit 4/day)     Unisom  Zantac       Tylenol PM         Warm milk-add vanilla or  Hemorrhoids:       Sugar for taste  Anusol/Anusol H.C.  (RX: Analapram 2.5%)  Sugar Substitutes:  Hydrocortisone OTC     Ok in moderation  Preparation H      Tucks        Vaseline lotion applied to tissue with  wiping    Herpes:     Throat:  Acyclovir      Oragel  Famvir  Valtrex     Vaccines:         Flu Shot Leg Cramps:       *Gardasil  Benadryl      Hepatitis A         Hepatitis B Nasal Spray:         Pneumovax  Saline Nasal Spray     Polio Booster         Tetanus Nausea:       Tuberculosis test or PPD  Vitamin B6 25 mg TID   AVOID:    Dramamine      *Gardasil  Emetrol       Live Poliovirus  Ginger Root 250 mg QID    MMR (measles, mumps &  High Complex Carbs @ Bedtime    rebella)  Sea Bands-Accupressure    Varicella (Chickenpox)  Unisom 1/2 tab TID     *No known complications           If received before Pain:         Known pregnancy;   Darvocet       Resume series after  Lortab        Delivery  Percocet    Yeast:   Tramadol      Femstat  Tylenol 3      Gyne-lotrimin  Ultram       Monistat  Vicodin           MISC:         All Sunscreens           Hair Coloring/highlights          Insect Repellant's          (Including DEET)         Mystic Tans Back Pain in Pregnancy Back pain during pregnancy is common. Back pain may be caused by several factors that are related to changes during your pregnancy. Follow these instructions at home: Managing pain, stiffness, and swelling  If directed, apply ice for sudden (acute) back pain. ? Put ice in a plastic bag. ? Place a towel between your skin and the bag. ? Leave the ice on for 20 minutes, 2-3 times per day.  If directed, apply heat to the affected area before you exercise: ? Place a towel between your skin and the heat pack or heating pad. ? Leave the heat on for 20-30 minutes. ? Remove the heat if your skin turns bright red. This is especially important if you are unable to feel pain, heat, or cold. You may have a greater risk of getting burned. Activity  Exercise as told by your health care provider. Exercising is the best way to prevent or manage back pain.  Listen to your body when lifting. If lifting hurts, ask for help or  bend your knees. This uses your leg muscles instead of your back muscles.  Squat down when picking up something from the floor. Do not bend over.  Only use bed rest as told by your health care provider. Bed rest should only be used for the most severe episodes of back pain. Standing, Sitting, and Lying Down  Do not stand in one place for long periods of time.  Use good posture when sitting. Make sure your head rests over your shoulders and is not hanging forward. Use a pillow on your lower back if necessary.  Try sleeping on your side, preferably the left side, with a pillow or two between your legs. If you are sore after a night's rest, your bed may be too soft. A firm mattress may provide more support for your back during pregnancy. General instructions  Do not wear high heels.  Eat a healthy diet. Try to gain weight within your health care provider's recommendations.  Use a maternity girdle, elastic sling, or   back brace as told by your health care provider.  Take over-the-counter and prescription medicines only as told by your health care provider.  Keep all follow-up visits as told by your health care provider. This is important. This includes any visits with any specialists, such as a physical therapist. Contact a health care provider if:  Your back pain interferes with your daily activities.  You have increasing pain in other parts of your body. Get help right away if:  You develop numbness, tingling, weakness, or problems with the use of your arms or legs.  You develop severe back pain that is not controlled with medicine.  You have a sudden change in bowel or bladder control.  You develop shortness of breath, dizziness, or you faint.  You develop nausea, vomiting, or sweating.  You have back pain that is a rhythmic, cramping pain similar to labor pains. Labor pain is usually 1-2 minutes apart, lasts for about 1 minute, and involves a bearing down feeling or pressure in  your pelvis.  You have back pain and your water breaks or you have vaginal bleeding.  You have back pain or numbness that travels down your leg.  Your back pain developed after you fell.  You develop pain on one side of your back.  You see blood in your urine.  You develop skin blisters in the area of your back pain. This information is not intended to replace advice given to you by your health care provider. Make sure you discuss any questions you have with your health care provider. Document Released: 05/07/2005 Document Revised: 07/05/2015 Document Reviewed: 10/11/2014 Elsevier Interactive Patient Education  2018 [employer]. Round Ligament Pain The round ligament is a cord of muscle and tissue that helps to support the uterus. It can become a source of pain during pregnancy if it becomes stretched or twisted as the baby grows. The pain usually begins in the second trimester of pregnancy, and it can come and go until the baby is delivered. It is not a serious problem, and it does not cause harm to the baby. Round ligament pain is usually a short, sharp, and pinching pain, but it can also be a dull, lingering, and aching pain. The pain is felt in the lower side of the abdomen or in the groin. It usually starts deep in the groin and moves up to the outside of the hip area. Pain can occur with:  A sudden change in position.  Rolling over in bed.  Coughing or sneezing.  Physical activity.  Follow these instructions at home: Watch your condition for any changes. Take these steps to help with your pain:  When the pain starts, relax. Then try: ? Sitting down. ? Flexing your knees up to your abdomen. ? Lying on your side with one pillow under your abdomen and another pillow between your legs. ? Sitting in a warm bath for 15-20 minutes or until the pain goes away.  Take over-the-counter and prescription medicines only as told by your health care provider.  Move slowly when you sit  and stand.  Avoid long walks if they cause pain.  Stop or lessen your physical activities if they cause pain.  Contact a health care provider if:  Your pain does not go away with treatment.  You feel pain in your back that you did not have before.  Your medicine is not helping. Get help right away if:  You develop a fever or chills.  You develop uterine contractions.  You develop vaginal bleeding.  You develop nausea or vomiting.  You develop diarrhea.  You have pain when you urinate. This information is not intended to replace advice given to you by your health care provider. Make sure you discuss any questions you have with your health care provider. Document Released: 11/06/2007 Document Revised: 07/05/2015 Document Reviewed: 04/05/2014 Elsevier Interactive Patient Education  [employer].

## 2017-05-15 ENCOUNTER — Ambulatory Visit (INDEPENDENT_AMBULATORY_CARE_PROVIDER_SITE_OTHER): Payer: Managed Care, Other (non HMO) | Admitting: Obstetrics and Gynecology

## 2017-05-15 VITALS — BP 86/50 | HR 71 | Wt 133.0 lb

## 2017-05-15 DIAGNOSIS — O26899 Other specified pregnancy related conditions, unspecified trimester: Secondary | ICD-10-CM

## 2017-05-15 DIAGNOSIS — Z3492 Encounter for supervision of normal pregnancy, unspecified, second trimester: Secondary | ICD-10-CM

## 2017-05-15 DIAGNOSIS — R12 Heartburn: Secondary | ICD-10-CM

## 2017-05-15 LAB — POCT URINALYSIS DIPSTICK
BILIRUBIN UA: NEGATIVE
GLUCOSE UA: NEGATIVE
Ketones, UA: NEGATIVE
Leukocytes, UA: NEGATIVE
Nitrite, UA: NEGATIVE
PH UA: 6.5 (ref 5.0–8.0)
Protein, UA: NEGATIVE
RBC UA: NEGATIVE
Spec Grav, UA: 1.01 (ref 1.010–1.025)
Urobilinogen, UA: 0.2 E.U./dL

## 2017-05-15 MED ORDER — RANITIDINE HCL 150 MG PO CAPS: 150.0000 mg | ORAL_CAPSULE | Freq: Two times a day (BID) | ORAL | 4 refills | Status: DC

## 2017-05-15 NOTE — Progress Notes (Signed)
ROB-doing well, discussed enrolling in classes &  (not) circumcision,some heartburn mostly at night-recommend zantac

## 2017-05-15 NOTE — Patient Instructions (Signed)
Heartburn During Pregnancy Heartburn is pain or discomfort in the throat or chest. It may cause a burning feeling. It happens when stomach acid moves up into the tube that carries food from your mouth to your stomach (esophagus). Heartburn is common during pregnancy. It usually goes away or gets better after giving birth. Follow these instructions at home: Eating and drinking  Do not drink alcohol while you are pregnant.  Figure out which foods and beverages make you feel worse, and avoid them.  Beverages that you may want to avoid include: ? Coffee and tea (with or without caffeine). ? Energy drinks and sports drinks. ? Bubbly (carbonated) drinks or sodas. ? Citrus fruit juices.  Foods that you may want to avoid include: ? Chocolate and cocoa. ? Peppermint and mint flavorings. ? Garlic, onions, and horseradish. ? Spicy and acidic foods. These include peppers, chili powder, curry powder, vinegar, hot sauces, and barbecue sauce. ? Citrus fruits, such as oranges, lemons, and limes. ? Tomato-based foods, such as red sauce, chili, and salsa. ? Fried and fatty foods, such as donuts, french fries, potato chips, and high-fat dressings. ? High-fat meats, such as hot dogs, cold cuts, sausage, ham, and bacon. ? High-fat dairy items, such as whole milk, butter, and cheese.  Eat small meals often, instead of large meals.  Avoid drinking a lot of liquid with your meals.  Avoid eating meals during the 2-3 hours before you go to bed.  Avoid lying down right after you eat.  Do not exercise right after you eat. Medicines  Take over-the-counter and prescription medicines only as told by your doctor.  Do not take aspirin, ibuprofen, or other NSAIDs unless your doctor tells you to do that.  Your doctor may tell you to avoid medicines that have sodium bicarbonate in them. General instructions  If told, raise the head of your bed about 6 inches (15 cm). You can do this by putting blocks under  the legs. Sleeping with more pillows does not help with heartburn.  Do not use any products that contain nicotine or tobacco, such as cigarettes and e-cigarettes. If you need help quitting, ask your doctor.  Wear loose-fitting clothing.  Try to lower your stress, such as with yoga or meditation. If you need help, ask your doctor.  Stay at a healthy weight. If you are overweight, work with your doctor to safely lose weight.  Keep all follow-up visits as told by your doctor. This is important. Contact a doctor if:  You get new symptoms.  Your symptoms do not get better with treatment.  You have weight loss and you do not know why.  You have trouble swallowing.  You make loud sounds when you breathe (wheeze).  You have a cough that does not go away.  You have heartburn often for more than 2 weeks.  You feel sick to your stomach (nauseous), and this does not get better with treatment.  You are throwing up (vomiting), and this does not get better with treatment.  You have pain in your belly (abdomen). Get help right away if:  You have very bad chest pain that spreads to your arm, neck, or jaw.  You feel sweaty, dizzy, or light-headed.  You have trouble breathing.  You have pain when swallowing.  You throw up and your throw-up looks like blood or coffee grounds.  Your poop (stool) is bloody or black. This information is not intended to replace advice given to you by your health care provider.   Make sure you discuss any questions you have with your health care provider. Document Released: 03/01/2010 Document Revised: 10/15/2015 Document Reviewed: 10/15/2015 Elsevier Interactive Patient Education  2017 [employer] Inc.  

## 2017-05-15 NOTE — Progress Notes (Signed)
ROB- pt is doing well 

## 2017-06-12 ENCOUNTER — Other Ambulatory Visit: Payer: Managed Care, Other (non HMO)

## 2017-06-12 ENCOUNTER — Encounter: Payer: Managed Care, Other (non HMO) | Admitting: Certified Nurse Midwife

## 2017-06-12 ENCOUNTER — Ambulatory Visit (INDEPENDENT_AMBULATORY_CARE_PROVIDER_SITE_OTHER): Payer: BLUE CROSS/BLUE SHIELD | Admitting: Certified Nurse Midwife

## 2017-06-12 VITALS — BP 111/73 | HR 73 | Wt 138.1 lb

## 2017-06-12 DIAGNOSIS — Z3403 Encounter for supervision of normal first pregnancy, third trimester: Secondary | ICD-10-CM

## 2017-06-12 DIAGNOSIS — Z23 Encounter for immunization: Secondary | ICD-10-CM

## 2017-06-12 DIAGNOSIS — Z130 Encounter for screening for diseases of the blood and blood-forming organs and certain disorders involving the immune mechanism: Secondary | ICD-10-CM

## 2017-06-12 DIAGNOSIS — Z131 Encounter for screening for diabetes mellitus: Secondary | ICD-10-CM

## 2017-06-12 DIAGNOSIS — Z113 Encounter for screening for infections with a predominantly sexual mode of transmission: Secondary | ICD-10-CM

## 2017-06-12 LAB — POCT URINALYSIS DIPSTICK
BILIRUBIN UA: NEGATIVE
Blood, UA: NEGATIVE
GLUCOSE UA: NEGATIVE
KETONES UA: NEGATIVE
Leukocytes, UA: NEGATIVE
NITRITE UA: NEGATIVE
PROTEIN UA: NEGATIVE
Spec Grav, UA: <=1.005 — AB (ref 1.010–1.025)
Urobilinogen, UA: 0.2 E.U./dL
pH, UA: 8.5 — AB (ref 5.0–8.0)

## 2017-06-12 MED ORDER — TETANUS-DIPHTH-ACELL PERTUSSIS 5-2.5-18.5 LF-MCG/0.5 IM SUSP
0.5000 mL | Freq: Once | INTRAMUSCULAR | Status: AC
  Administered 2017-06-12: 0.5 mL via INTRAMUSCULAR

## 2017-06-12 NOTE — Progress Notes (Signed)
Pt is here for an ROB visit. 

## 2017-06-12 NOTE — Patient Instructions (Signed)
[facility]  [place], [place], [place] [address]  Phone: [phone]   [facility] (second location)  [address] [place], [place] [address]  Phone: [phone]   [facility]) [employer], [place], [place] [address] Phone: [phone]   [facility] [address] [place], [place] [address]  Phone: [phone] Relief During Labor and Delivery Many things can cause pain during labor and delivery, including:  Pressure on bones and ligaments due to the baby moving through the pelvis.  Stretching of tissues due to the baby moving through the birth canal.  Muscle tension due to anxiety or nervousness.  The uterus tightening (contracting) and relaxing to help move the baby.  There are many ways to deal with the pain of labor and delivery. They include:  Taking prenatal classes. Taking these classes helps you know what to expect during your baby's birth. What you learn will increase your confidence and decrease your anxiety.  Practicing relaxation techniques or doing relaxing activities, such as: ? Focused breathing. ? Meditation. ? Visualization. ? Aroma therapy. ? Listening to your favorite music. ? Hypnosis.  Taking a warm shower or bath (hydrotherapy). This may: ? Provide comfort and relaxation. ? Lessen your perception of pain. ? Decrease the amount of pain medicine needed. ? Decrease the length of labor.  Getting a massage or counterpressure on your back.  Applying warm packs or ice packs.  Changing positions often, moving around, or using a birthing ball.  Getting: ? Pain medicine through an IV or injection into a muscle. ? Pain medicine inserted into your spinal column. ? Injections of sterile water just under the skin on your lower back (intradermal injections). ? Laughing gas (nitrous oxide).  Discuss your pain control options with your health  care provider during your prenatal visits. Explore the options offered by your hospital or birth center. What kinds of medicine are available? There are two kinds of medicines that can be used to relieve pain during labor and delivery:  Analgesics. These medicines decrease pain without causing you to lose feeling or the ability to move your muscles.  Anesthetics. These medicines block feeling in the body and can decrease your ability to move freely.  Both of these kinds of medicine can cause minor side effects, such as nausea, trouble concentrating, and sleepiness. They can also decrease the baby's heart rate before birth and affect the baby's breathing rate after birth. For this reason, health care providers are careful about when and how much medicine is given. What are specific medicines and procedures that provide pain relief? Local Anesthetics Local anesthetics are used to numb a small area of the body. They may be used along with another kind of anesthetic or used to numb the nerves of the vagina, cervix, and perineum during the second stage of labor. General Anesthetics General anesthetics cause you to lose consciousness so you do not feel pain. They are usually only used for an emergency cesarean delivery. General anesthetics are given through an IV tube and a mask. Pudendal Block A pudendal block is a form of local anesthetic. It may be used to relieve the pain associated with pushing or stretching of the perineum at the time of delivery or to further numb the perineum. A pudendal block is done by injecting numbing medicine through the vaginal wall into a nerve in the pelvis. Epidural Analgesia Epidural analgesia is given through a flexible IV catheter  that is inserted into the lower back. Numbing medicine is delivered continuously to the area near your spinal column nerves (epidural space). After having this type of analgesia, you may be able to move your legs but you most likely will not  be able to walk. Depending on the amount of medicine given, you may lose all feeling in the lower half of your body, or you may retain some level of sensation, including the urge to push. Epidural analgesia can be used to provide pain relief for a vaginal birth. Spinal Block A spinal block is similar to epidural analgesia, but the medicine is injected into the spinal fluid instead of the epidural space. A spinal block is only given once. It starts to relieve pain quickly, but the pain relief lasts only 1-6 hours. Spinal blocks can be used for cesarean deliveries. Combined Spinal-Epidural (CSE) Block A CSE block combines the effects of a spinal block and epidural analgesia. The spinal block works quickly to block all pain. The epidural analgesia provides continuous pain relief, even after the effects of the spinal block have worn off. This information is not intended to replace advice given to you by your health care provider. Make sure you discuss any questions you have with your health care provider. Document Released: 05/15/2008 Document Revised: 07/06/2015 Document Reviewed: 06/20/2015 Elsevier Interactive Patient Education  2018 [employer] Inc[employer] WHAT OB PATIENTS CAN EXPECT   Confirmation of pregnancy and ultrasound ordered if medically indicated-[redacted] weeks gestation  New OB (NOB) intake with nurse and New OB (NOB) labs- [redacted] weeks gestation  New OB (NOB) physical examination with provider- 11/[redacted] weeks gestation  Flu vaccine-[redacted] weeks gestation  Anatomy scan-[redacted] weeks gestation  Glucose tolerance test, blood work to test for anemia, T-dap vaccine-[redacted] weeks gestation  Vaginal swabs/cultures-STD/Group B strep-[redacted] weeks gestation  Appointments every 4 weeks until 28 weeks  Every 2 weeks from 28 weeks until 36 weeks  Weekly visits from 36 weeks until delivery  Common Medications Safe in Pregnancy  Acne:      Constipation:  Benzoyl Peroxide     Colace  Clindamycin      Dulcolax  Suppository  Topica Erythromycin     Fibercon  Salicylic Acid      Metamucil         Miralax AVOID:        Senakot   Accutane    Cough:  Retin-A       Cough Drops  Tetracycline      Phenergan w/ Codeine if Rx  Minocycline      Robitussin (Plain & DM)  Antibiotics:     Crabs/Lice:  Ceclor       RID  Cephalosporins    AVOID:  E-Mycins      Kwell  Keflex  Macrobid/Macrodantin   Diarrhea:  Penicillin      Kao-Pectate  Zithromax      Imodium AD         PUSH FLUIDS AVOID:       Cipro     Fever:  Tetracycline      Tylenol (Regular or Extra  Minocycline       Strength)  Levaquin      Extra Strength-Do not          Exceed 8 tabs/24 hrs Caffeine:        '200mg'$ /day (equiv. To 1 cup of coffee or  approx. 3 12 oz sodas)         Gas: Cold/Hayfever:  Gas-X  Benadryl      Mylicon  Claritin       Phazyme  **Claritin-D        Chlor-Trimeton    Headaches:  Dimetapp      ASA-Free Excedrin  Drixoral-Non-Drowsy     Cold Compress  Mucinex (Guaifenasin)     Tylenol (Regular or Extra  Sudafed/Sudafed-12 Hour     Strength)  **Sudafed PE Pseudoephedrine   Tylenol Cold & Sinus     Vicks Vapor Rub  Zyrtec  **AVOID if Problems With Blood Pressure         Heartburn: Avoid lying down for at least 1 hour after meals  Aciphex      Maalox     Rash:  Milk of Magnesia     Benadryl    Mylanta       1% Hydrocortisone Cream  Pepcid  Pepcid Complete   Sleep Aids:  Prevacid      Ambien   Prilosec       Benadryl  Rolaids       Chamomile Tea  Tums (Limit 4/day)     Unisom  Zantac       Tylenol PM         Warm milk-add vanilla or  Hemorrhoids:       Sugar for taste  Anusol/Anusol H.C.  (RX: Analapram 2.5%)  Sugar Substitutes:  Hydrocortisone OTC     Ok in moderation  Preparation H      Tucks        Vaseline lotion applied to tissue with wiping    Herpes:     Throat:  Acyclovir      Oragel  Famvir  Valtrex     Vaccines:         Flu Shot Leg  Cramps:       *Gardasil  Benadryl      Hepatitis A         Hepatitis B Nasal Spray:       Pneumovax  Saline Nasal Spray     Polio Booster         Tetanus Nausea:       Tuberculosis test or PPD  Vitamin B6 25 mg TID   AVOID:    Dramamine      *Gardasil  Emetrol       Live Poliovirus  Ginger Root 250 mg QID    MMR (measles, mumps &  High Complex Carbs @ Bedtime    rebella)  Sea Bands-Accupressure    Varicella (Chickenpox)  Unisom 1/2 tab TID     *No known complications           If received before Pain:         Known pregnancy;   Darvocet       Resume series after  Lortab        Delivery  Percocet    Yeast:   Tramadol      Femstat  Tylenol 3      Gyne-lotrimin  Ultram       Monistat  Vicodin           MISC:         All Sunscreens           Hair Coloring/highlights          Insect Repellant's          (Including DEET)         Mystic Tans Third Trimester of Pregnancy The third trimester is  from week 29 through week 42, months 7 through 9. This trimester is when your unborn baby (fetus) is growing very fast. At the end of the ninth month, the unborn baby is about 20 inches in length. It weighs about 6-10 pounds. Follow these instructions at home:  Avoid all smoking, herbs, and alcohol. Avoid drugs not approved by your doctor.  Do not use any tobacco products, including cigarettes, chewing tobacco, and electronic cigarettes. If you need help quitting, ask your doctor. You may get counseling or other support to help you quit.  Only take medicine as told by your doctor. Some medicines are safe and some are not during pregnancy.  Exercise only as told by your doctor. Stop exercising if you start having cramps.  Eat regular, healthy meals.  Wear a good support bra if your breasts are tender.  Do not use hot tubs, steam rooms, or saunas.  Wear your seat belt when driving.  Avoid raw meat, uncooked cheese, and liter boxes and soil used by cats.  Take your prenatal  vitamins.  Take 1500-2000 milligrams of calcium daily starting at the 20th week of pregnancy until you deliver your baby.  Try taking medicine that helps you poop (stool softener) as needed, and if your doctor approves. Eat more fiber by eating fresh fruit, vegetables, and whole grains. Drink enough fluids to keep your pee (urine) clear or pale yellow.  Take warm water baths (sitz baths) to soothe pain or discomfort caused by hemorrhoids. Use hemorrhoid cream if your doctor approves.  If you have puffy, bulging veins (varicose veins), wear support hose. Raise (elevate) your feet for 15 minutes, 3-4 times a day. Limit salt in your diet.  Avoid heavy lifting, wear low heels, and sit up straight.  Rest with your legs raised if you have leg cramps or low back pain.  Visit your dentist if you have not gone during your pregnancy. Use a soft toothbrush to brush your teeth. Be gentle when you floss.  You can have sex (intercourse) unless your doctor tells you not to.  Do not travel far distances unless you must. Only do so with your doctor's approval.  Take prenatal classes.  Practice driving to the hospital.  Pack your hospital bag.  Prepare the baby's room.  Go to your doctor visits. Get help if:  You are not sure if you are in labor or if your water has broken.  You are dizzy.  You have mild cramps or pressure in your lower belly (abdominal).  You have a nagging pain in your belly area.  You continue to feel sick to your stomach (nauseous), throw up (vomit), or have watery poop (diarrhea).  You have bad smelling fluid coming from your vagina.  You have pain with peeing (urination). Get help right away if:  You have a fever.  You are leaking fluid from your vagina.  You are spotting or bleeding from your vagina.  You have severe belly cramping or pain.  You lose or gain weight rapidly.  You have trouble catching your breath and have chest pain.  You notice sudden  or extreme puffiness (swelling) of your face, hands, ankles, feet, or legs.  You have not felt the baby move in over an hour.  You have severe headaches that do not go away with medicine.  You have vision changes. This information is not intended to replace advice given to you by your health care provider. Make sure you discuss any questions you have  with your health care provider. Document Released: 04/23/2009 Document Revised: 07/05/2015 Document Reviewed: 03/30/2012 Elsevier Interactive Patient Education  2017 [employer].

## 2017-06-13 LAB — CBC
HEMOGLOBIN: 11.9 g/dL (ref 11.1–15.9)
Hematocrit: 34.8 % (ref 34.0–46.6)
MCH: 33.1 pg — AB (ref 26.6–33.0)
MCHC: 34.2 g/dL (ref 31.5–35.7)
MCV: 97 fL (ref 79–97)
Platelets: 276 10*3/uL (ref 150–379)
RBC: 3.59 x10E6/uL — AB (ref 3.77–5.28)
RDW: 12.7 % (ref 12.3–15.4)
WBC: 7.5 10*3/uL (ref 3.4–10.8)

## 2017-06-13 LAB — RPR: RPR Ser Ql: NONREACTIVE

## 2017-06-13 LAB — GLUCOSE, 1 HOUR GESTATIONAL: GESTATIONAL DIABETES SCREEN: 107 mg/dL (ref 65–139)

## 2017-06-15 NOTE — Progress Notes (Addendum)
ROB-Doing well. 28 week labs today. BTC reviewed and signed. TDaP given. Discussed childhood immunizations; handouts given. Plans breastfeeding and condoms. Enrolled in June's water birth class. Discussed NCB as well as the use of birth affirmations and doula.  Anticipatory guidance of regarding course of prenatal care. Reviewed red flag symptoms and when to call. RTC x 2 weeks for ROB or sooner if needed.

## 2017-06-29 ENCOUNTER — Encounter: Payer: Self-pay | Admitting: Certified Nurse Midwife

## 2017-06-29 ENCOUNTER — Ambulatory Visit (INDEPENDENT_AMBULATORY_CARE_PROVIDER_SITE_OTHER): Payer: BLUE CROSS/BLUE SHIELD | Admitting: Certified Nurse Midwife

## 2017-06-29 VITALS — BP 108/76 | HR 81 | Temp 97.7°F | Wt 141.3 lb

## 2017-06-29 DIAGNOSIS — Z3403 Encounter for supervision of normal first pregnancy, third trimester: Secondary | ICD-10-CM

## 2017-06-29 DIAGNOSIS — Z3A30 30 weeks gestation of pregnancy: Secondary | ICD-10-CM

## 2017-06-29 LAB — POCT URINALYSIS DIPSTICK
Bilirubin, UA: NEGATIVE
Glucose, UA: NEGATIVE
Ketones, UA: NEGATIVE
Leukocytes, UA: NEGATIVE
Nitrite, UA: NEGATIVE
PH UA: 5 (ref 5.0–8.0)
Protein, UA: NEGATIVE
Spec Grav, UA: 1.02 (ref 1.010–1.025)
UROBILINOGEN UA: 0.2 U/dL

## 2017-06-29 NOTE — Patient Instructions (Signed)
[facility]   [facility]  [address], [place], [place] [address]  Phone: [phone]   [facility] (second location)  [address] [place], [place] [address]  Phone: [phone]   [facility] ([place]) [address], [place], [place] [address] Phone: [phone]   [facility]  [address] , [place] [address]  Phone: [phone] a Baby Grows During Pregnancy Pregnancy begins when a female's sperm enters a female's egg (fertilization). This happens in one of the tubes (fallopian tubes) that connect the ovaries to the womb (uterus). The fertilized egg is called an embryo until it reaches 10 weeks. From 10 weeks until birth, it is called a fetus. The fertilized egg moves down the fallopian tube to the uterus. Then it implants into the lining of the uterus and begins to grow. The developing fetus receives oxygen and nutrients through the pregnant woman's bloodstream and the tissues that grow (placenta) to support the fetus. The placenta is the life support system for the fetus. It provides nutrition and removes waste. Learning as much as you can about your pregnancy and how your baby is developing can help you enjoy the experience. It can also make you aware of when there might be a problem and when to ask questions. How long does a typical pregnancy last? A pregnancy usually lasts 280 days, or about 40 weeks. Pregnancy is divided into three trimesters:  First trimester: 0-13 weeks.  Second trimester: 14-27 weeks.  Third trimester: 28-40 weeks.  The day when your baby is considered ready to be born (full term) is your estimated date of delivery. How does my baby develop month by month? First month  The fertilized egg attaches to the inside of the uterus.  Some cells will form the placenta. Others will form the fetus.  The arms, legs, brain, spinal cord, lungs, and heart begin to develop.  At  the end of the first month, the heart begins to beat.  Second month  The bones, inner ear, eyelids, hands, and feet form.  The genitals develop.  By the end of 8 weeks, all major organs are developing.  Third month  All of the internal organs are forming.  Teeth develop below the gums.  Bones and muscles begin to grow. The spine can flex.  The skin is transparent.  Fingernails and toenails begin to form.  Arms and legs continue to grow longer, and hands and feet develop.  The fetus is about 3 in (7.6 cm) long.  Fourth month  The placenta is completely formed.  The external sex organs, neck, outer ear, eyebrows, eyelids, and fingernails are formed.  The fetus can hear, swallow, and move its arms and legs.  The kidneys begin to produce urine.  The skin is covered with a white waxy coating (vernix) and very fine hair (lanugo).  Fifth month  The fetus moves around more and can be felt for the first time (quickening).  The fetus starts to sleep and wake up and may begin to suck its finger.  The nails grow to the end of the fingers.  The organ in the digestive system that makes bile (gallbladder) functions and helps to digest the nutrients.  If your baby is a girl, eggs are present in her ovaries. If your baby is a boy, testicles start to move down into his scrotum.  Sixth month  The lungs are formed, but the fetus is not yet able to breathe.    The eyes open. The brain continues to develop.  Your baby has fingerprints and toe prints. Your baby's hair grows thicker.  At the end of the second trimester, the fetus is about 9 in (22.9 cm) long.  Seventh month  The fetus kicks and stretches.  The eyes are developed enough to sense changes in light.  The hands can make a grasping motion.  The fetus responds to sound.  Eighth month  All organs and body systems are fully developed and functioning.  Bones harden and taste buds develop. The fetus may  hiccup.  Certain areas of the brain are still developing. The skull remains soft.  Ninth month  The fetus gains about  lb (0.23 kg) each week.  The lungs are fully developed.  Patterns of sleep develop.  The fetus's head typically moves into a head-down position (vertex) in the uterus to prepare for birth. If the buttocks move into a vertex position instead, the baby is breech.  The fetus weighs 6-9 lbs (2.72-4.08 kg) and is 19-20 in (48.26-50.8 cm) long.  What can I do to have a healthy pregnancy and help my baby develop? Eating and Drinking  Eat a healthy diet. ? Talk with your health care provider to make sure that you are getting the nutrients that you and your baby need. ? Visit www.choosemyplate.gov to learn about creating a healthy diet.  Gain a healthy amount of weight during pregnancy as advised by your health care provider. This is usually 25-35 pounds. You may need to: ? Gain more if you were underweight before getting pregnant or if you are pregnant with more than one baby. ? Gain less if you were overweight or obese when you got pregnant.  Medicines and Vitamins  Take prenatal vitamins as directed by your health care provider. These include vitamins such as folic acid, iron, calcium, and vitamin D. They are important for healthy development.  Take medicines only as directed by your health care provider. Read labels and ask a pharmacist or your health care provider whether over-the-counter medicines, supplements, and prescription drugs are safe to take during pregnancy.  Activities  Be physically active as advised by your health care provider. Ask your health care provider to recommend activities that are safe for you to do, such as walking or swimming.  Do not participate in strenuous or extreme sports.  Lifestyle  Do not drink alcohol.  Do not use any tobacco products, including cigarettes, chewing tobacco, or electronic cigarettes. If you need help quitting,  ask your health care provider.  Do not use illegal drugs.  Safety  Avoid exposure to mercury, lead, or other heavy metals. Ask your health care provider about common sources of these heavy metals.  Avoid listeria infection during pregnancy. Follow these precautions: ? Do not eat soft cheeses or deli meats. ? Do not eat hot dogs unless they have been warmed up to the point of steaming, such as in the microwave oven. ? Do not drink unpasteurized milk.  Avoid toxoplasmosis infection during pregnancy. Follow these precautions: ? Do not change your cat's litter box, if you have a cat. Ask someone else to do this for you. ? Wear gardening gloves while working in the yard.  General Instructions  Keep all follow-up visits as directed by your health care provider. This is important. This includes prenatal care and screening tests.  Manage any chronic health conditions. Work closely with your health care provider to keep conditions, such as diabetes, under control.    How do I know if my baby is developing well? At each prenatal visit, your health care provider will do several different tests to check on your health and keep track of your baby's development. These include:  Fundal height. ? Your health care provider will measure your growing belly from top to bottom using a tape measure. ? Your health care provider will also feel your belly to determine your baby's position.  Heartbeat. ? An ultrasound in the first trimester can confirm pregnancy and show a heartbeat, depending on how far along you are. ? Your health care provider will check your baby's heart rate at every prenatal visit. ? As you get closer to your delivery date, you may have regular fetal heart rate monitoring to make sure that your baby is not in distress.  Second trimester ultrasound. ? This ultrasound checks your baby's development. It also indicates your baby's gender.  What should I do if I have concerns about my  baby's development? Always talk with your health care provider about any concerns that you may have. This information is not intended to replace advice given to you by your health care provider. Make sure you discuss any questions you have with your health care provider. Document Released: 07/16/2007 Document Revised: 07/05/2015 Document Reviewed: 07/06/2013 Elsevier Interactive Patient Education  2018 Elsevier Inc.  

## 2017-06-29 NOTE — Progress Notes (Signed)
Pt is here for an ROB visit. Thick yellow sputum, ears are sore. She just finished a 10 day dose of amoxicillin for bronchitis.

## 2017-06-29 NOTE — Progress Notes (Addendum)
ROB, doing well. Has recently completed course of antibiotics for respiratory infections. She states she is still having some congestion but denies fever. Discussed use of albuterol inhaler if she continues to have congestion that is causing wheezing of SOB. She verbalizes and agrees to plan of care. On Ascultation of FHT's today arrhythmia heard. Reassurance given. Discussed referral to MFM if present at next [facility] appointment. Pt states she is having some braxton hicks contractions. PTL precautions reviewed.Taking NCLEX exam mid June.  Follow up 2 wks.   [name], CNM

## 2017-07-07 ENCOUNTER — Telehealth: Payer: Self-pay

## 2017-07-07 NOTE — Telephone Encounter (Signed)
Verified patient by name and dob via phone. Patient [id number] c/o braxton hicks that started yesterday, less than 6 contractions in an hour.She reports good fetal movement,denies any fever,backache,bleeding or LOF. Patient admits to walking more and doing a little more at home than usual, reports adequate fluid intake. Reviewed self care measures at home, tylenol,rest,lukewarm bath,and belly band.  Reassurance given,reviewed red flag symptoms and when to call. Patient verbalized with plan and agreement.  Shanika Creacy,SNM/Jade Burright,cnm

## 2017-07-07 NOTE — Telephone Encounter (Signed)
Pt called to inform us she has had Braxton Hicks contractions starting yesterday- 07/06/17. She states they are painful and her stomach gets hard. She states they are less than 5 minutes apart. Denies bleeding, loss of fluid, fever discharge or backache -states baby is active.

## 2017-07-08 ENCOUNTER — Other Ambulatory Visit: Payer: Self-pay

## 2017-07-08 MED ORDER — RANITIDINE HCL 150 MG PO CAPS: 150.0000 mg | ORAL_CAPSULE | Freq: Two times a day (BID) | ORAL | 1 refills | Status: DC

## 2017-07-14 ENCOUNTER — Encounter: Payer: Self-pay | Admitting: Obstetrics and Gynecology

## 2017-07-14 ENCOUNTER — Other Ambulatory Visit: Payer: Self-pay | Admitting: Obstetrics and Gynecology

## 2017-07-14 MED ORDER — CYANOCOBALAMIN 1000 MCG/ML IJ SOLN: 1000.0000 ug | INTRAMUSCULAR | 1 refills | Status: DC

## 2017-07-17 ENCOUNTER — Ambulatory Visit (INDEPENDENT_AMBULATORY_CARE_PROVIDER_SITE_OTHER): Payer: Managed Care, Other (non HMO) | Admitting: Obstetrics and Gynecology

## 2017-07-17 DIAGNOSIS — Z3493 Encounter for supervision of normal pregnancy, unspecified, third trimester: Secondary | ICD-10-CM | POA: Diagnosis not present

## 2017-07-17 LAB — POCT URINALYSIS DIPSTICK
BILIRUBIN UA: NEGATIVE
Blood, UA: NEGATIVE
GLUCOSE UA: NEGATIVE
KETONES UA: NEGATIVE
Nitrite, UA: NEGATIVE
PH UA: 6.5 (ref 5.0–8.0)
Protein, UA: NEGATIVE
Spec Grav, UA: 1.015 (ref 1.010–1.025)
UROBILINOGEN UA: 0.2 U/dL

## 2017-07-17 MED ORDER — BETAMETHASONE SOD PHOS & ACET 6 (3-3) MG/ML IJ SUSP
12.0000 mg | Freq: Once | INTRAMUSCULAR | Status: AC
  Administered 2017-07-17: 12 mg via INTRAMUSCULAR

## 2017-07-17 NOTE — Progress Notes (Signed)
ROB- urine sent for culture, FFN obtained, no arrythmia heard, PTL precautions discussed and patient on pelvic rest betamethasone given and will come to hospital tomorrow for 2nd dose

## 2017-07-17 NOTE — Addendum Note (Signed)
Addended by: Rosine BeatLONTZ, AMY L on: 07/17/2017 02:48 PM   Modules accepted: Orders

## 2017-07-17 NOTE — Progress Notes (Signed)
ROB- pt is having some menstrual cramping 

## 2017-07-17 NOTE — Patient Instructions (Signed)
Preventing Preterm Birth °Preterm birth is when your baby is delivered between 20 weeks and 37 weeks of pregnancy. A full-term pregnancy lasts for at least 37 weeks. Preterm birth can be dangerous for your baby because the last few weeks of pregnancy are an important time for your baby's brain and lungs to grow. Many things can cause a baby to be born early. Sometimes the cause is not known. There are certain factors that make you more likely to experience preterm birth, such as: °· Having a previous baby born preterm. °· Being pregnant with twins or other multiples. °· Having had fertility treatment. °· Being overweight or underweight at the start of your pregnancy. °· Having any of the following during pregnancy: °? An infection, including a urinary tract infection (UTI) or an STI (sexually transmitted infection). °? High blood pressure. °? Diabetes. °? Vaginal bleeding. °· Being age 35 or older. °· Being age 18 or younger. °· Getting pregnant within 6 months of a previous pregnancy. °· Suffering extreme stress or physical or emotional abuse during pregnancy. °· Standing for long periods of time during pregnancy, such as working at a job that requires standing. ° °What are the risks? °The most serious risk of preterm birth is that the baby may not survive. This is more likely to happen if a baby is born before 34 weeks. Other risks and complications of preterm birth may include your baby having: °· Breathing problems. °· Brain damage that affects movement and coordination (cerebral palsy). °· Feeding difficulties. °· Vision or hearing problems. °· Infections or inflammation of the digestive tract (colitis). °· Developmental delays. °· Learning disabilities. °· Higher risk for diabetes, heart disease, and high blood pressure later in life. ° °What can I do to lower my risk? °Medical care ° °The most important thing you can do to lower your risk for preterm birth is to get routine medical care during pregnancy  (prenatal care). If you have a high risk of preterm birth, you may be referred to a health care provider who specializes in managing high-risk pregnancies (perinatologist). You may be given medicine to help prevent preterm birth. °Lifestyle changes °Certain lifestyle changes can also lower your risk of preterm birth: °· Wait at least 6 months after a pregnancy to become pregnant again. °· Try to plan pregnancy for when you are between 19 and 35 years old. °· Get to a healthy weight before getting pregnant. If you are overweight, work with your health care provider to safely lose weight. °· Do not use any products that contain nicotine or tobacco, such as cigarettes and e-cigarettes. If you need help quitting, ask your health care provider. °· Do not drink alcohol. °· Do not use drugs. ° °Where to find support: °For more support, consider: °· Talking with your health care provider. °· Talking with a therapist or substance abuse counselor, if you need help quitting. °· Working with a diet and nutrition specialist (dietitian) or a personal trainer to maintain a healthy weight. °· Joining a support group. ° °Where to find more information: °Learn more about preventing preterm birth from: °· Centers for Disease Control and Prevention: cdc.gov/reproductivehealth/maternalinfanthealth/pretermbirth.htm °· March of Dimes: marchofdimes.org/complications/premature-babies.aspx °· American Pregnancy Association: americanpregnancy.org/labor-and-birth/premature-labor ° °Contact a health care provider if: °· You have any of the following signs of preterm labor before 37 weeks: °? A change or increase in vaginal discharge. °? Fluid leaking from your vagina. °? Pressure or cramps in your lower abdomen. °? A backache that does not   go away or gets worse. °? Regular tightening (contractions) in your lower abdomen. °Summary °· Preterm birth means having your baby during weeks 20-37 of pregnancy. °· Preterm birth may put your baby at risk  for physical and mental problems. °· Getting good prenatal care can help prevent preterm birth. °· You can lower your risk of preterm birth by making certain lifestyle changes, such as not smoking and not using alcohol. °This information is not intended to replace advice given to you by your health care provider. Make sure you discuss any questions you have with your health care provider. °Document Released: 03/13/2015 Document Revised: 10/06/2015 Document Reviewed: 10/06/2015 °Elsevier Interactive Patient Education © 2018 Elsevier Inc. ° °

## 2017-07-17 NOTE — Addendum Note (Signed)
Addended by: Brooke DareSICK, Chelsia Serres L on: 07/17/2017 04:42 PM   Modules accepted: Orders

## 2017-07-18 ENCOUNTER — Encounter (INDEPENDENT_AMBULATORY_CARE_PROVIDER_SITE_OTHER): Payer: Self-pay

## 2017-07-18 ENCOUNTER — Inpatient Hospital Stay
Admission: EM | Admit: 2017-07-18 | Discharge: 2017-07-18 | Disposition: A | Discharge status: Home / Self Care | Payer: BLUE CROSS/BLUE SHIELD | Attending: Obstetrics and Gynecology | Admitting: Obstetrics and Gynecology

## 2017-07-18 DIAGNOSIS — Z3A34 34 weeks gestation of pregnancy: Secondary | ICD-10-CM | POA: Diagnosis not present

## 2017-07-18 LAB — FETAL FIBRONECTIN: Fetal Fibronectin: NEGATIVE

## 2017-07-18 MED ORDER — BETAMETHASONE SOD PHOS & ACET 6 (3-3) MG/ML IJ SUSP
INTRAMUSCULAR | Status: AC
  Administered 2017-07-18: 12 mg via INTRAMUSCULAR
  Filled 2017-07-18: qty 1

## 2017-07-18 MED ORDER — BETAMETHASONE SOD PHOS & ACET 6 (3-3) MG/ML IJ SUSP
12.0000 mg | Freq: Once | INTRAMUSCULAR | Status: AC
  Administered 2017-07-18: 12 mg via INTRAMUSCULAR

## 2017-07-19 LAB — URINE CULTURE: Organism ID, Bacteria: NO GROWTH

## 2017-07-24 ENCOUNTER — Ambulatory Visit (INDEPENDENT_AMBULATORY_CARE_PROVIDER_SITE_OTHER): Payer: Managed Care, Other (non HMO) | Admitting: Certified Nurse Midwife

## 2017-07-24 VITALS — BP 117/77 | HR 95 | Wt 141.7 lb

## 2017-07-24 DIAGNOSIS — Z3403 Encounter for supervision of normal first pregnancy, third trimester: Secondary | ICD-10-CM | POA: Diagnosis not present

## 2017-07-24 LAB — POCT URINALYSIS DIPSTICK
BILIRUBIN UA: NEGATIVE
Glucose, UA: NEGATIVE
KETONES UA: NEGATIVE
NITRITE UA: NEGATIVE
PH UA: 6 (ref 5.0–8.0)
PROTEIN UA: NEGATIVE
Spec Grav, UA: >=1.03 — AB (ref 1.010–1.025)
UROBILINOGEN UA: 0.2 U/dL

## 2017-07-24 MED ORDER — HYDROXYZINE HCL 25 MG PO TABS: 25.0000 mg | ORAL_TABLET | Freq: Four times a day (QID) | ORAL | 0 refills | Status: DC | PRN

## 2017-07-24 NOTE — Patient Instructions (Signed)
Hydroxyzine capsules or tablets What is this medicine? HYDROXYZINE (hye [place] i zeen) is an antihistamine. This medicine is used to treat allergy symptoms. It is also used to treat anxiety and tension. This medicine can be used with other medicines to induce sleep before surgery. This medicine may be used for other purposes; ask your health care provider or pharmacist if you have questions. COMMON BRAND NAME(S): ANX, Atarax, Rezine, Vistaril What should I tell my health care provider before I take this medicine? They need to know if you have any of these conditions: -any chronic illness -difficulty passing urine -glaucoma -heart disease -kidney disease -liver disease -lung disease -an unusual or allergic reaction to hydroxyzine, cetirizine, other medicines, foods, dyes, or preservatives -pregnant or trying to get pregnant -breast-feeding How should I use this medicine? Take this medicine by mouth with a full glass of water. Follow the directions on the prescription label. You may take this medicine with food or on an empty stomach. Take your medicine at regular intervals. Do not take your medicine more often than directed. Talk to your pediatrician regarding the use of this medicine in children. Special care may be needed. While this drug may be prescribed for children as young as 75 years of age for selected conditions, precautions do apply. Patients over 62 years old may have a stronger reaction and need a smaller dose. Overdosage: If you think you have taken too much of this medicine contact a poison control center or emergency room at once. NOTE: This medicine is only for you. Do not share this medicine with others. What if I miss a dose? If you miss a dose, take it as soon as you can. If it is almost time for your next dose, take only that dose. Do not take double or extra doses. What may interact with this medicine? -alcohol -barbiturate medicines for sleep or seizures -medicines for  colds, allergies -medicines for depression, anxiety, or emotional disturbances -medicines for pain -medicines for sleep -muscle relaxants This list may not describe all possible interactions. Give your health care provider a list of all the medicines, herbs, non-prescription drugs, or dietary supplements you use. Also tell them if you smoke, drink alcohol, or use illegal drugs. Some items may interact with your medicine. What should I watch for while using this medicine? Tell your doctor or health care professional if your symptoms do not improve. You may get drowsy or dizzy. Do not drive, use machinery, or do anything that needs mental alertness until you know how this medicine affects you. Do not stand or sit up quickly, especially if you are an older patient. This reduces the risk of dizzy or fainting spells. Alcohol may interfere with the effect of this medicine. Avoid alcoholic drinks. Your mouth may get dry. Chewing sugarless gum or sucking hard candy, and drinking plenty of water may help. Contact your doctor if the problem does not go away or is severe. This medicine may cause dry eyes and blurred vision. If you wear contact lenses you may feel some discomfort. Lubricating drops may help. See your eye doctor if the problem does not go away or is severe. If you are receiving skin tests for allergies, tell your doctor you are using this medicine. What side effects may I notice from receiving this medicine? Side effects that you should report to your doctor or health care professional as soon as possible: -fast or irregular heartbeat -difficulty passing urine -seizures -slurred speech or confusion -tremor Side effects that  usually do not require medical attention (report to your doctor or health care professional if they continue or are bothersome): °-constipation °-drowsiness °-fatigue °-headache °-stomach upset °This list may not describe all possible side effects. Call your doctor for  medical advice about side effects. You may report side effects to FDA at 1-800-FDA-1088. °Where should I keep my medicine? °Keep out of the reach of children. °Store at room temperature between 15 and 30 degrees C (59 and 86 degrees F). Keep container tightly closed. Throw away any unused medicine after the expiration date. °NOTE: This sheet is a summary. It may not cover all possible information. If you have questions about this medicine, talk to your doctor, pharmacist, or health care provider. °© 2018 Elsevier/Gold Standard (2007-06-11 14:50:59) °Preterm Labor and Birth Information °Pregnancy normally lasts 39-41 weeks. Preterm labor is when labor starts early. It starts before you have been pregnant for 37 whole weeks. °What are the risk factors for preterm labor? °Preterm labor is more likely to occur in women who: °· Have an infection while pregnant. °· Have a cervix that is short. °· Have gone into preterm labor before. °· Have had surgery on their cervix. °· Are younger than age 17. °· Are older than age 35. °· Are African American. °· Are pregnant with two or more babies. °· Take street drugs while pregnant. °· Smoke while pregnant. °· Do not gain enough weight while pregnant. °· Got pregnant right after another pregnancy. ° °What are the symptoms of preterm labor? °Symptoms of preterm labor include: °· Cramps. The cramps may feel like the cramps some women get during their period. The cramps may happen with watery poop (diarrhea). °· Pain in the belly (abdomen). °· Pain in the lower back. °· Regular contractions or tightening. It may feel like your belly is getting tighter. °· Pressure in the lower belly that seems to get stronger. °· More fluid (discharge) leaking from the vagina. The fluid may be watery or bloody. °· Water breaking. ° °Why is it important to notice signs of preterm labor? °Babies who are born early may not be fully developed. They have a higher chance for: °· Long-term heart  problems. °· Long-term lung problems. °· Trouble controlling body systems, like breathing. °· Bleeding in the brain. °· A condition called cerebral palsy. °· Learning difficulties. °· Death. ° °These risks are highest for babies who are born before 34 weeks of pregnancy. °How is preterm labor treated? °Treatment depends on: °· How long you were pregnant. °· Your condition. °· The health of your baby. ° °Treatment may involve: °· Having a stitch (suture) placed in your cervix. When you give birth, your cervix opens so the baby can come out. The stitch keeps the cervix from opening too soon. °· Staying at the hospital. °· Taking or getting medicines, such as: °? Hormone medicines. °? Medicines to stop contractions. °? Medicines to help the baby’s lungs develop. °? Medicines to prevent your baby from having cerebral palsy. ° °What should I do if I am in preterm labor? °If you think you are going into labor too soon, call your doctor right away. °How can I prevent preterm labor? °· Do not use any tobacco products. °? Examples of these are cigarettes, chewing tobacco, and e-cigarettes. °? If you need help quitting, ask your doctor. °· Do not use street drugs. °· Do not use any medicines unless you ask your doctor if they are safe for you. °· Talk with your doctor before   taking any herbal supplements. °· Make sure you gain enough weight. °· Watch for infection. If you think you might have an infection, get it checked right away. °· If you have gone into preterm labor before, tell your doctor. °This information is not intended to replace advice given to you by your health care provider. Make sure you discuss any questions you have with your health care provider. °Document Released: 04/25/2008 Document Revised: 07/10/2015 Document Reviewed: 06/20/2015 °Elsevier Interactive Patient Education © 2018 Elsevier Inc. ° °

## 2017-07-24 NOTE — Progress Notes (Signed)
ROB-pt stated that she is have lower pelvic pressure with pain that feels like cramps. Pt stated that she has been noticing heavy discharge all day today. Yellow

## 2017-07-26 ENCOUNTER — Observation Stay
Admission: EM | Admit: 2017-07-26 | Discharge: 2017-07-26 | Disposition: A | Discharge status: Home / Self Care | Payer: Managed Care, Other (non HMO) | Attending: Certified Nurse Midwife | Admitting: Certified Nurse Midwife

## 2017-07-26 DIAGNOSIS — O26893 Other specified pregnancy related conditions, third trimester: Secondary | ICD-10-CM | POA: Diagnosis present

## 2017-07-26 DIAGNOSIS — Z3A34 34 weeks gestation of pregnancy: Secondary | ICD-10-CM

## 2017-07-26 DIAGNOSIS — N9489 Other specified conditions associated with female genital organs and menstrual cycle: Secondary | ICD-10-CM | POA: Insufficient documentation

## 2017-07-26 LAB — URINALYSIS, ROUTINE W REFLEX MICROSCOPIC
Bilirubin Urine: NEGATIVE
GLUCOSE, UA: NEGATIVE mg/dL
Hgb urine dipstick: NEGATIVE
Ketones, ur: NEGATIVE mg/dL
LEUKOCYTES UA: NEGATIVE
NITRITE: NEGATIVE
PH: 6 (ref 5.0–8.0)
Protein, ur: NEGATIVE mg/dL
Specific Gravity, Urine: 1.002 — ABNORMAL LOW (ref 1.005–1.030)

## 2017-07-26 LAB — WET PREP, GENITAL
Sperm: NONE SEEN
TRICH WET PREP: NONE SEEN
Yeast Wet Prep HPF POC: NONE SEEN

## 2017-07-26 MED ORDER — ACETAMINOPHEN 500 MG PO TABS
1000.0000 mg | ORAL_TABLET | Freq: Four times a day (QID) | ORAL | Status: DC | PRN
  Filled 2017-07-26: qty 2

## 2017-07-26 MED ORDER — METRONIDAZOLE 500 MG PO TABS
500.0000 mg | ORAL_TABLET | Freq: Two times a day (BID) | ORAL | Status: DC
Start: 2017-07-26 — End: 2017-07-27
  Administered 2017-07-26: 500 mg via ORAL
  Filled 2017-07-26: qty 1

## 2017-07-26 MED ORDER — METRONIDAZOLE 500 MG PO TABS: 500.0000 mg | ORAL_TABLET | Freq: Two times a day (BID) | ORAL | 0 refills | Status: DC

## 2017-07-26 MED ORDER — HYDROXYZINE HCL 50 MG PO TABS
50.0000 mg | ORAL_TABLET | Freq: Once | ORAL | Status: AC
  Administered 2017-07-26: 50 mg via ORAL
  Filled 2017-07-26: qty 1

## 2017-07-26 NOTE — OB Triage Note (Signed)
Patient came in complaining of consistent abdominal cramping since 1430 today. Patient reports being in the pool when suddenly get increase in abdominal cramping. Patient rates pain a 8 out of 10. Reports positive fetal movement, denies vaginal bleeding, reports occasional scant amount of vaginal discharge.

## 2017-07-27 NOTE — Progress Notes (Signed)
ROB-Reports pelvic cramping and increased vaginal discharge. SVE unchanged from previous exam. Discussed home treatment measures. Rx: Vistaril, see orders. [place] [facility] [place] Reviewed red flag symptoms and when to call. RTC x 1 week for ROB or sooner if needed.

## 2017-07-31 ENCOUNTER — Encounter: Payer: Self-pay | Admitting: Certified Nurse Midwife

## 2017-07-31 ENCOUNTER — Ambulatory Visit (INDEPENDENT_AMBULATORY_CARE_PROVIDER_SITE_OTHER): Payer: BLUE CROSS/BLUE SHIELD | Admitting: Certified Nurse Midwife

## 2017-07-31 VITALS — BP 115/73 | HR 75 | Wt 144.6 lb

## 2017-07-31 DIAGNOSIS — Z3403 Encounter for supervision of normal first pregnancy, third trimester: Secondary | ICD-10-CM

## 2017-07-31 DIAGNOSIS — Z3A35 35 weeks gestation of pregnancy: Secondary | ICD-10-CM

## 2017-07-31 LAB — POCT URINALYSIS DIPSTICK
BILIRUBIN UA: NEGATIVE
GLUCOSE UA: NEGATIVE
Ketones, UA: NEGATIVE
LEUKOCYTES UA: NEGATIVE
Nitrite, UA: NEGATIVE
PH UA: 5 (ref 5.0–8.0)
Protein, UA: NEGATIVE
RBC UA: NEGATIVE
Spec Grav, UA: <=1.005 — AB (ref 1.010–1.025)
Urobilinogen, UA: 0.2 E.U./dL

## 2017-07-31 NOTE — Progress Notes (Signed)
ROB, doing well. GBS and cultures today due to pt having increased cramping. Feels good movement. PTL precautions reviewed . Follow up 1 wk.   Doreene BurkeAnnie Keigan Tafoya, CNM

## 2017-07-31 NOTE — Discharge Summary (Signed)
Obstetric Discharge Summary  Patient ID: Theresa Knight MRN: [id number] DOB/AGE: 08-09-1995 22 y.o.   Date of Admission: 07/26/2017 Serafina RoyalsMichelle Evelynn Hench, CNM [facility](D. Evans, MD)  Date of Discharge: 07/26/2017 Serafina RoyalsMichelle Deontrae Drinkard, CNM [facility](D. Evans, MD)  Admitting Diagnosis: Observation at 5270w4d  Secondary Diagnosis: Preterm contractions, Status post betamethasone    Discharge Diagnosis: No other diagnosis   Antepartum Procedures: NST    Brief Hospital Course   L&D OB Triage Note  Theresa Knight is a 22 y.o. G1P0000 female at 7470w4d, EDD Estimated Date of Delivery: 09/02/17 who presented to triage for complaints of increased lower uterine cramping and contractions.  She was evaluated by the nurses with no significant finding for preterm labor or fetal distress. Vital signs stable. An NST was performed and has been reviewed by CNM. She was treated with vistaril.   NST INTERPRETATION: Indications: rule out uterine contractions  Mode: External Baseline Rate (A): 135 bpm Variability: Moderate Accelerations: 15 x 15 Decelerations: None     Contraction Frequency (min): irritability   Impression: reactive   Plan: NST performed was reviewed and was found to be reactive. She was discharged home with bleeding/labor precautions.  Continue routine prenatal care. Follow up with CNM as previously scheduled.   Discharge Instructions: Per After Visit Summary  Activity: Refer to After Visit Summary  Diet: Regular  Medications: Allergies as of 07/26/2017   No Known Allergies     Medication List    TAKE these medications   cyanocobalamin 1000 MCG/ML injection Commonly known as:  (VITAMIN B-12) Inject 1 mL (1,000 mcg total) into the muscle every 30 (thirty) days.   FERRALET 90 90-1 MG Tabs Take by mouth.   hydrOXYzine 25 MG tablet Commonly known as:  ATARAX/VISTARIL Take 1 tablet (25 mg total) by mouth every 6 (six) hours as needed (contractions).   metroNIDAZOLE 500 MG  tablet Commonly known as:  FLAGYL Take 1 tablet (500 mg total) by mouth 2 (two) times daily.   Omega 3 1200 MG Caps   prenatal multivitamin Tabs tablet Take 1 tablet daily at 12 noon by mouth.   ranitidine 150 MG capsule Commonly known as:  ZANTAC Take 1 capsule (150 mg total) by mouth 2 (two) times daily.      Outpatient follow up:    Postpartum contraception: condoms  Discharged Condition: stable  Discharged to: home   Gunnar BullaJenkins Michelle Adriel Kessen, CNM [facility], [facility]

## 2017-07-31 NOTE — Progress Notes (Signed)
Pt is here for an ROB visit and is due for cultures. 

## 2017-07-31 NOTE — Patient Instructions (Signed)
Group B Streptococcus Infection During Pregnancy Group B Streptococcus (GBS) is a type of bacteria (Streptococcus agalactiae) that is often found in healthy people, commonly in the rectum, vagina, and intestines. In people who are healthy and not pregnant, the bacteria rarely cause serious illness or complications. However, women who test positive for GBS during pregnancy can pass the bacteria to their baby during childbirth, which can cause serious infection in the baby after birth. Women with GBS may also have infections during their pregnancy or immediately after childbirth, such as such as urinary tract infections (UTIs) or infections of the uterus (uterine infections). Having GBS also increases a woman's risk of complications during pregnancy, such as early (preterm) labor or delivery, miscarriage, or stillbirth. Routine testing (screening) for GBS is recommended for all pregnant women. What increases the risk? You may have a higher risk for GBS infection during pregnancy if you had one during a past pregnancy. What are the signs or symptoms? In most cases, GBS infection does not cause symptoms in pregnant women. Signs and symptoms of a possible GBS-related infection may include:  Labor starting before the 37th week of pregnancy.  A UTI or bladder infection, which may cause: ? Fever. ? Pain or burning during urination. ? Frequent urination.  Fever during labor, along with: ? Bad-smelling discharge. ? Uterine tenderness. ? Rapid heartbeat in the mother, baby, or both.  Rare but serious symptoms of a possible GBS-related infection in women include:  Blood infection (septicemia). This may cause fever, chills, or confusion.  Lung infection (pneumonia). This may cause fever, chills, cough, rapid breathing, difficulty breathing, or chest pain.  Bone, joint, skin, or soft tissue infection.  How is this diagnosed? You may be screened for GBS between week 35 and week 37 of your pregnancy. If  you have symptoms of preterm labor, you may be screened earlier. This condition is diagnosed based on lab test results from:  A swab of fluid from the vagina and rectum.  A urine sample.  How is this treated? This condition is treated with antibiotic medicine. When you go into labor, or as soon as your water breaks (your membranes rupture), you will be given antibiotics through an IV tube. Antibiotics will continue until after you give birth. If you are having a cesarean delivery, you do not need antibiotics unless your membranes have already ruptured. Follow these instructions at home:  Take over-the-counter and prescription medicines only as told by your health care provider.  Take your antibiotic medicine as told by your health care provider. Do not stop taking the antibiotic even if you start to feel better.  Keep all pre-birth (prenatal) visits and follow-up visits as told by your health care provider. This is important. Contact a health care provider if:  You have pain or burning when you urinate.  You have to urinate frequently.  You have a fever or chills.  You develop a bad-smelling vaginal discharge. Get help right away if:  Your membranes rupture.  You go into labor.  You have severe pain in your abdomen.  You have difficulty breathing.  You have chest pain. This information is not intended to replace advice given to you by your health care provider. Make sure you discuss any questions you have with your health care provider. Document Released: 05/06/2007 Document Revised: 08/24/2015 Document Reviewed: 08/23/2015 Elsevier Interactive Patient Education  2018 [employer]  

## 2017-08-02 ENCOUNTER — Encounter (INDEPENDENT_AMBULATORY_CARE_PROVIDER_SITE_OTHER): Payer: Self-pay

## 2017-08-02 LAB — STREP GP B NAA: Strep Gp B NAA: NEGATIVE

## 2017-08-03 LAB — GC/CHLAMYDIA PROBE AMP
Chlamydia trachomatis, NAA: NEGATIVE
Neisseria gonorrhoeae by PCR: NEGATIVE

## 2017-08-07 ENCOUNTER — Ambulatory Visit (INDEPENDENT_AMBULATORY_CARE_PROVIDER_SITE_OTHER): Payer: BLUE CROSS/BLUE SHIELD | Admitting: Certified Nurse Midwife

## 2017-08-07 VITALS — BP 117/80 | HR 85 | Wt 145.2 lb

## 2017-08-07 DIAGNOSIS — Z3493 Encounter for supervision of normal pregnancy, unspecified, third trimester: Secondary | ICD-10-CM | POA: Diagnosis not present

## 2017-08-07 LAB — POCT URINALYSIS DIPSTICK
Bilirubin, UA: NEGATIVE
GLUCOSE UA: NEGATIVE
KETONES UA: NEGATIVE
LEUKOCYTES UA: NEGATIVE
Nitrite, UA: NEGATIVE
Protein, UA: NEGATIVE
RBC UA: NEGATIVE
SPEC GRAV UA: 1.01 (ref 1.010–1.025)
Urobilinogen, UA: 0.2 E.U./dL
pH, UA: 7 (ref 5.0–8.0)

## 2017-08-07 NOTE — Progress Notes (Signed)
Pt is here for an ROB visit. She would also like to be checked.

## 2017-08-07 NOTE — Progress Notes (Signed)
ROB-Reports increased cramping, vaginal discharge, and hemorrhoids; requests SVE. Copy of Waterbirth Class Certificate signed and placed in chart. Understands waterbirth is allowed after 37 weeks. Encouraged creation of birth plan, template given. Discussed home treatment measures including use of herbal prep. Reviewed red flag symptoms and when to call. RTC x 1 week for ROB or sooner if needed.

## 2017-08-07 NOTE — Patient Instructions (Signed)
Vaginal Delivery Vaginal delivery means that you will give birth by pushing your baby out of your birth canal (vagina). A team of health care providers will help you before, during, and after vaginal delivery. Birth experiences are unique for every woman and every pregnancy, and birth experiences vary depending on where you choose to give birth. What should I do to prepare for my baby's birth? Before your baby is born, it is important to talk with your health care provider about:  Your labor and delivery preferences. These may include: ? Medicines that you may be given. ? How you will manage your pain. This might include non-medical pain relief techniques or injectable pain relief such as epidural analgesia. ? How you and your baby will be monitored during labor and delivery. ? Who may be in the labor and delivery room with you. ? Your feelings about surgical delivery of your baby (cesarean delivery, or C-section) if this becomes necessary. ? Your feelings about receiving donated blood through an IV tube (blood transfusion) if this becomes necessary.  Whether you are able: ? To take pictures or videos of the birth. ? To eat during labor and delivery. ? To move around, walk, or change positions during labor and delivery.  What to expect after your baby is born, such as: ? Whether delayed umbilical cord clamping and cutting is offered. ? Who will care for your baby right after birth. ? Medicines or tests that may be recommended for your baby. ? Whether breastfeeding is supported in your hospital or birth center. ? How long you will be in the hospital or birth center.  How any medical conditions you have may affect your baby or your labor and delivery experience.  To prepare for your baby's birth, you should also:  Attend all of your health care visits before delivery (prenatal visits) as recommended by your health care provider. This is important.  Prepare your home for your baby's  arrival. Make sure that you have: ? Diapers. ? Baby clothing. ? Feeding equipment. ? Safe sleeping arrangements for you and your baby.  Install a car seat in your vehicle. Have your car seat checked by a certified car seat installer to make sure that it is installed safely.  Think about who will help you with your new baby at home for at least the first several weeks after delivery.  What can I expect when I arrive at the birth center or hospital? Once you are in labor and have been admitted into the hospital or birth center, your health care provider may:  Review your pregnancy history and any concerns you have.  Insert an IV tube into one of your veins. This is used to give you fluids and medicines.  Check your blood pressure, pulse, temperature, and heart rate (vital signs).  Check whether your bag of water (amniotic sac) has broken (ruptured).  Talk with you about your birth plan and discuss pain control options.  Monitoring Your health care provider may monitor your contractions (uterine monitoring) and your baby's heart rate (fetal monitoring). You may need to be monitored:  Often, but not continuously (intermittently).  All the time or for long periods at a time (continuously). Continuous monitoring may be needed if: ? You are taking certain medicines, such as medicine to relieve pain or make your contractions stronger. ? You have pregnancy or labor complications.  Monitoring may be done by:  Placing a special stethoscope or a handheld monitoring device on your abdomen to   check your baby's heartbeat, and feeling your abdomen for contractions. This method of monitoring does not continuously record your baby's heartbeat or your contractions.  Placing monitors on your abdomen (external monitors) to record your baby's heartbeat and the frequency and length of contractions. You may not have to wear external monitors all the time.  Placing monitors inside of your uterus  (internal monitors) to record your baby's heartbeat and the frequency, length, and strength of your contractions. ? Your health care provider may use internal monitors if he or she needs more information about the strength of your contractions or your baby's heart rate. ? Internal monitors are put in place by passing a thin, flexible wire through your vagina and into your uterus. Depending on the type of monitor, it may remain in your uterus or on your baby's head until birth. ? Your health care provider will discuss the benefits and risks of internal monitoring with you and will ask for your permission before inserting the monitors.  Telemetry. This is a type of continuous monitoring that can be done with external or internal monitors. Instead of having to stay in bed, you are able to move around during telemetry. Ask your health care provider if telemetry is an option for you.  Physical exam Your health care provider may perform a physical exam. This may include:  Checking whether your baby is positioned: ? With the head toward your vagina (head-down). This is most common. ? With the head toward the top of your uterus (head-up or breech). If your baby is in a breech position, your health care provider may try to turn your baby to a head-down position so you can deliver vaginally. If it does not seem that your baby can be born vaginally, your provider may recommend surgery to deliver your baby. In rare cases, you may be able to deliver vaginally if your baby is head-up (breech delivery). ? Lying sideways (transverse). Babies that are lying sideways cannot be delivered vaginally.  Checking your cervix to determine: ? Whether it is thinning out (effacing). ? Whether it is opening up (dilating). ? How low your baby has moved into your birth canal.  What are the three stages of labor and delivery?  Normal labor and delivery is divided into the following three stages: Stage 1  Stage 1 is the  longest stage of labor, and it can last for hours or days. Stage 1 includes: ? Early labor. This is when contractions may be irregular, or regular and mild. Generally, early labor contractions are more than 10 minutes apart. ? Active labor. This is when contractions get longer, more regular, more frequent, and more intense. ? The transition phase. This is when contractions happen very close together, are very intense, and may last longer than during any other part of labor.  Contractions generally feel mild, infrequent, and irregular at first. They get stronger, more frequent (about every 2-3 minutes), and more regular as you progress from early labor through active labor and transition.  Many women progress through stage 1 naturally, but you may need help to continue making progress. If this happens, your health care provider may talk with you about: ? Rupturing your amniotic sac if it has not ruptured yet. ? Giving you medicine to help make your contractions stronger and more frequent.  Stage 1 ends when your cervix is completely dilated to 4 inches (10 cm) and completely effaced. This happens at the end of the transition phase. Stage 2  Once   your cervix is completely effaced and dilated to 4 inches (10 cm), you may start to feel an urge to push. It is common for the body to naturally take a rest before feeling the urge to push, especially if you received an epidural or certain other pain medicines. This rest period may last for up to 1-2 hours, depending on your unique labor experience.  During stage 2, contractions are generally less painful, because pushing helps relieve contraction pain. Instead of contraction pain, you may feel stretching and burning pain, especially when the widest part of your baby's head passes through the vaginal opening (crowning).  Your health care provider will closely monitor your pushing progress and your baby's progress through the vagina during stage 2.  Your  health care provider may massage the area of skin between your vaginal opening and anus (perineum) or apply warm compresses to your perineum. This helps it stretch as the baby's head starts to crown, which can help prevent perineal tearing. ? In some cases, an incision may be made in your perineum (episiotomy) to allow the baby to pass through the vaginal opening. An episiotomy helps to make the opening of the vagina larger to allow more room for the baby to fit through.  It is very important to breathe and focus so your health care provider can control the delivery of your baby's head. Your health care provider may have you decrease the intensity of your pushing, to help prevent perineal tearing.  After delivery of your baby's head, the shoulders and the rest of the body generally deliver very quickly and without difficulty.  Once your baby is delivered, the umbilical cord may be cut right away, or this may be delayed for 1-2 minutes, depending on your baby's health. This may vary among health care providers, hospitals, and birth centers.  If you and your baby are healthy enough, your baby may be placed on your chest or abdomen to help maintain the baby's temperature and to help you bond with each other. Some mothers and babies start breastfeeding at this time. Your health care team will dry your baby and help keep your baby warm during this time.  Your baby may need immediate care if he or she: ? Showed signs of distress during labor. ? Has a medical condition. ? Was born too early (prematurely). ? Had a bowel movement before birth (meconium). ? Shows signs of difficulty transitioning from being inside the uterus to being outside of the uterus. If you are planning to breastfeed, your health care team will help you begin a feeding. Stage 3  The third stage of labor starts immediately after the birth of your baby and ends after you deliver the placenta. The placenta is an organ that develops  during pregnancy to provide oxygen and nutrients to your baby in the womb.  Delivering the placenta may require some pushing, and you may have mild contractions. Breastfeeding can stimulate contractions to help you deliver the placenta.  After the placenta is delivered, your uterus should tighten (contract) and become firm. This helps to stop bleeding in your uterus. To help your uterus contract and to control bleeding, your health care provider may: ? Give you medicine by injection, through an IV tube, by mouth, or through your rectum (rectally). ? Massage your abdomen or perform a vaginal exam to remove any blood clots that are left in your uterus. ? Empty your bladder by placing a thin, flexible tube (catheter) into your bladder. ? Encourage   you to breastfeed your baby. After labor is over, you and your baby will be monitored closely to ensure that you are both healthy until you are ready to go home. Your health care team will teach you how to care for yourself and your baby. This information is not intended to replace advice given to you by your health care provider. Make sure you discuss any questions you have with your health care provider. Document Released: 11/06/2007 Document Revised: 08/17/2015 Document Reviewed: 02/11/2015 Elsevier Interactive Patient Education  2018 [employer]  

## 2017-08-08 ENCOUNTER — Other Ambulatory Visit: Payer: Self-pay

## 2017-08-08 ENCOUNTER — Observation Stay
Admission: EM | Admit: 2017-08-08 | Discharge: 2017-08-09 | Disposition: A | Discharge status: Home / Self Care | Payer: BLUE CROSS/BLUE SHIELD | Attending: Certified Nurse Midwife | Admitting: Certified Nurse Midwife

## 2017-08-08 ENCOUNTER — Encounter: Payer: Self-pay | Admitting: Certified Nurse Midwife

## 2017-08-08 DIAGNOSIS — O4703 False labor before 37 completed weeks of gestation, third trimester: Secondary | ICD-10-CM | POA: Insufficient documentation

## 2017-08-08 DIAGNOSIS — O2243 Hemorrhoids in pregnancy, third trimester: Principal | ICD-10-CM | POA: Insufficient documentation

## 2017-08-08 DIAGNOSIS — Z3A36 36 weeks gestation of pregnancy: Secondary | ICD-10-CM | POA: Insufficient documentation

## 2017-08-08 DIAGNOSIS — Z3490 Encounter for supervision of normal pregnancy, unspecified, unspecified trimester: Secondary | ICD-10-CM

## 2017-08-08 DIAGNOSIS — R109 Unspecified abdominal pain: Secondary | ICD-10-CM | POA: Diagnosis present

## 2017-08-08 HISTORY — DX: Polycystic ovarian syndrome: E28.2

## 2017-08-08 MED ORDER — LIDOCAINE HCL URETHRAL/MUCOSAL 2 % EX GEL: 1.0000 "application " | Freq: Once | CUTANEOUS | 0 refills | Status: AC

## 2017-08-08 MED ORDER — LIDOCAINE HCL URETHRAL/MUCOSAL 2 % EX GEL
1.0000 "application " | Freq: Once | CUTANEOUS | Status: AC
  Administered 2017-08-08: 1 via TOPICAL
  Filled 2017-08-08: qty 5

## 2017-08-08 MED ORDER — BENZOCAINE-MENTHOL 20-0.5 % EX AERO
1.0000 | INHALATION_SPRAY | Freq: Four times a day (QID) | CUTANEOUS | 0 refills | Status: DC | PRN
Start: 2017-08-08 — End: 2017-08-14

## 2017-08-08 MED ORDER — BENZOCAINE-MENTHOL 20-0.5 % EX AERO
1.0000 "application " | INHALATION_SPRAY | Freq: Four times a day (QID) | CUTANEOUS | Status: DC | PRN
  Administered 2017-08-08: 1 via TOPICAL
  Filled 2017-08-08: qty 56

## 2017-08-08 MED ORDER — LIDOCAINE HCL (PF) 1 % IJ SOLN
30.0000 mL | Freq: Once | INTRAMUSCULAR | Status: AC
  Administered 2017-08-08: 10 mL via INTRADERMAL
  Filled 2017-08-08: qty 30

## 2017-08-08 NOTE — Discharge Instructions (Signed)
Hemorrhoids Hemorrhoids are swollen veins in and around the rectum or anus. There are two types of hemorrhoids:  Internal hemorrhoids. These occur in the veins that are just inside the rectum. They may poke through to the outside and become irritated and painful.  External hemorrhoids. These occur in the veins that are outside of the anus and can be felt as a painful swelling or hard lump near the anus.  Most hemorrhoids do not cause serious problems, and they can be managed with home treatments such as diet and lifestyle changes. If home treatments do not help your symptoms, procedures can be done to shrink or remove the hemorrhoids. What are the causes? This condition is caused by increased pressure in the anal area. This pressure may result from various things, including:  Constipation.  Straining to have a bowel movement.  Diarrhea.  Pregnancy.  Obesity.  Sitting for long periods of time.  Heavy lifting or other activity that causes you to strain.  Anal sex.  What are the signs or symptoms? Symptoms of this condition include:  Pain.  Anal itching or irritation.  Rectal bleeding.  Leakage of stool (feces).  Anal swelling.  One or more lumps around the anus.  How is this diagnosed? This condition can often be diagnosed through a visual exam. Other exams or tests may also be done, such as:  Examination of the rectal area with a gloved hand (digital rectal exam).  Examination of the anal canal using a small tube (anoscope).  A blood test, if you have lost a significant amount of blood.  A test to look inside the colon (sigmoidoscopy or colonoscopy).  How is this treated? This condition can usually be treated at home. However, various procedures may be done if dietary changes, lifestyle changes, and other home treatments do not help your symptoms. These procedures can help make the hemorrhoids smaller or remove them completely. Some of these procedures involve  surgery, and others do not. Common procedures include:  Rubber band ligation. Rubber bands are placed at the base of the hemorrhoids to cut off the blood supply to them.  Sclerotherapy. Medicine is injected into the hemorrhoids to shrink them.  Infrared coagulation. A type of light energy is used to get rid of the hemorrhoids.  Hemorrhoidectomy surgery. The hemorrhoids are surgically removed, and the veins that supply them are tied off.  Stapled hemorrhoidopexy surgery. A circular stapling device is used to remove the hemorrhoids and use staples to cut off the blood supply to them.  Follow these instructions at home: Eating and drinking  Eat foods that have a lot of fiber in them, such as whole grains, beans, nuts, fruits, and vegetables. Ask your health care provider about taking products that have added fiber (fiber supplements).  Drink enough fluid to keep your urine clear or pale yellow. Managing pain and swelling  Take warm sitz baths for 20 minutes, 3-4 times a day to ease pain and discomfort.  If directed, apply ice to the affected area. Using ice packs between sitz baths may be helpful. ? Put ice in a plastic bag. ? Place a towel between your skin and the bag. ? Leave the ice on for 20 minutes, 2-3 times a day. General instructions  Take over-the-counter and prescription medicines only as told by your health care provider.  Use medicated creams or suppositories as told.  Exercise regularly.  Go to the bathroom when you have the urge to have a bowel movement. Do not wait.    Avoid straining to have bowel movements.  Keep the anal area dry and clean. Use wet toilet paper or moist towelettes after a bowel movement.  Do not sit on the toilet for long periods of time. This increases blood pooling and pain. Contact a health care provider if:  You have increasing pain and swelling that are not controlled by treatment or medicine.  You have uncontrolled bleeding.  You  have difficulty having a bowel movement, or you are unable to have a bowel movement.  You have pain or inflammation outside the area of the hemorrhoids. This information is not intended to replace advice given to you by your health care provider. Make sure you discuss any questions you have with your health care provider. Document Released: 01/25/2000 Document Revised: 06/27/2015 Document Reviewed: 10/11/2014 Elsevier Interactive Patient Education  2018 [employer]  

## 2017-08-08 NOTE — OB Triage Note (Signed)
Pt arrival to triage with c/o hemorrhoid pain 8/10.  Pt denies vaginal bleeding, leaking of fluid, and states positive fetal movement.  Pt states she has been having contractions periodically for the last week.  EFM and toco applied and assessing.

## 2017-08-09 DIAGNOSIS — Z3A36 36 weeks gestation of pregnancy: Secondary | ICD-10-CM

## 2017-08-09 DIAGNOSIS — O4703 False labor before 37 completed weeks of gestation, third trimester: Secondary | ICD-10-CM

## 2017-08-09 DIAGNOSIS — O2243 Hemorrhoids in pregnancy, third trimester: Secondary | ICD-10-CM | POA: Diagnosis not present

## 2017-08-09 NOTE — OB Triage Note (Signed)
Discharge instructions provided and reviewed.  Follow up care discussed.  Pt verbalized understanding. 

## 2017-08-10 NOTE — Telephone Encounter (Signed)
We can refer her if needed. Thanks, JML

## 2017-08-10 NOTE — Telephone Encounter (Signed)
Please check with patient and see if she needs anything else. I saw her in the hospital on Saturday night. Thanks, JML

## 2017-08-11 ENCOUNTER — Observation Stay
Admission: EM | Admit: 2017-08-11 | Discharge: 2017-08-11 | Disposition: A | Discharge status: Home / Self Care | Payer: BLUE CROSS/BLUE SHIELD | Attending: Certified Nurse Midwife | Admitting: Certified Nurse Midwife

## 2017-08-11 ENCOUNTER — Encounter: Payer: Self-pay | Admitting: Obstetrics and Gynecology

## 2017-08-11 ENCOUNTER — Other Ambulatory Visit: Payer: Self-pay

## 2017-08-11 ENCOUNTER — Telehealth: Payer: Self-pay | Admitting: Obstetrics and Gynecology

## 2017-08-11 ENCOUNTER — Ambulatory Visit (INDEPENDENT_AMBULATORY_CARE_PROVIDER_SITE_OTHER): Payer: BLUE CROSS/BLUE SHIELD | Admitting: Certified Nurse Midwife

## 2017-08-11 VITALS — BP 113/81 | HR 89 | Wt 145.3 lb

## 2017-08-11 DIAGNOSIS — N898 Other specified noninflammatory disorders of vagina: Secondary | ICD-10-CM | POA: Diagnosis present

## 2017-08-11 DIAGNOSIS — Z3A36 36 weeks gestation of pregnancy: Secondary | ICD-10-CM

## 2017-08-11 DIAGNOSIS — O4703 False labor before 37 completed weeks of gestation, third trimester: Principal | ICD-10-CM | POA: Insufficient documentation

## 2017-08-11 DIAGNOSIS — Z3493 Encounter for supervision of normal pregnancy, unspecified, third trimester: Secondary | ICD-10-CM | POA: Diagnosis not present

## 2017-08-11 LAB — POCT URINALYSIS DIPSTICK
Bilirubin, UA: NEGATIVE
Glucose, UA: NEGATIVE
Ketones, UA: NEGATIVE
Leukocytes, UA: NEGATIVE
NITRITE UA: NEGATIVE
PH UA: 6 (ref 5.0–8.0)
PROTEIN UA: NEGATIVE
RBC UA: NEGATIVE
UROBILINOGEN UA: 0.2 U/dL

## 2017-08-11 LAB — ROM PLUS (ARMC ONLY): Rom Plus: NEGATIVE

## 2017-08-11 NOTE — OB Triage Note (Signed)
Pt states big gush of fluid at 1240 this morning. Pt states that it was clear and large amount. Pt states that that she has had several other gushes through the day. Pt went to her appointment this afternoon and was sent over from the office for ROM +. Spoke to CNM and she states to collect ROM +. Monitors applied and assessing. VSS.

## 2017-08-11 NOTE — Telephone Encounter (Signed)
The patient called and stated that she is spotting and is having blood in her discharge/ Amy picked up and spoke with the patient.

## 2017-08-11 NOTE — Progress Notes (Signed)
Problem OB visit-Reports vaginal spotting and leakage of fluid since this morning. Nitrazine negative, fern negative. SVE unchanged. Will send patient to birth place for ROM plus to verify intact membrane status. Report called to birth place. RTC as needed.

## 2017-08-11 NOTE — Progress Notes (Signed)
Pt is here with poss leaking fluid. Has bright red spotting. Has had some cramping.

## 2017-08-11 NOTE — Discharge Summary (Signed)
Obstetric Discharge Summary  Patient ID: Peggye FormSavannah L Garfield MRN: [id number] DOB/AGE: 03/09/1995 22 y.o.   Date of Admission: 08/11/2017 Serafina RoyalsMichelle Ola Raap, CNM [facility](A. Cherry, MD)  Date of Discharge: 08/11/2017 Serafina RoyalsMichelle Andreal Vultaggio, CNM [facility](A. Cherry, MD)  Admitting Diagnosis: Observation at 481w6d     Discharge Diagnosis: No other diagnosis   Antepartum Procedures: NST   Brief Hospital Course   L&D OB Triage Note  Peggye FormSavannah L Folks is a 22 y.o. G1P0000 female at 331w6d, EDD Estimated Date of Delivery: 09/02/17 who presented to triage for complaints of regular uterine contractions and leakage of fluid.  She was evaluated by the nurses with no significant findings for labor, fetal distress, or spontaneous rupture of membranes. Vital signs stable. An NST was performed and has been reviewed by CNM.   NST INTERPRETATION: Indications: rule out uterine contractions  Mode: External Baseline Rate (A): 140 bpm Variability: Moderate Accelerations: 15 x 15 Decelerations: None     Contraction Frequency (min): 3-4  Impression: reactive   Plan: NST performed was reviewed and was found to be reactive. She was discharged home with bleeding/labor precautions.  Continue routine prenatal care. Follow up with CNM as previously scheduled.    Discharge Instructions: Per After Visit Summary.  Activity: Refer to After Visit Summary.  Diet: Regular  Medications:  Allergies as of 08/11/2017   No Known Allergies     Medication List    ASK your doctor about these medications   benzocaine-Menthol 20-0.5 % Aero Commonly known as:  DERMOPLAST Apply 1 application topically 4 (four) times daily as needed for irritation.   cyanocobalamin 1000 MCG/ML injection Commonly known as:  (VITAMIN B-12) Inject 1 mL (1,000 mcg total) into the muscle every 30 (thirty) days.   FERRALET 90 90-1 MG Tabs Take by mouth.   hydrOXYzine 25 MG tablet Commonly known as:  ATARAX/VISTARIL Take 1 tablet (25 mg total) by mouth  every 6 (six) hours as needed (contractions).   lidocaine 2 % jelly Commonly known as:  XYLOCAINE   Omega 3 1200 MG Caps   prenatal multivitamin Tabs tablet Take 1 tablet daily at 12 noon by mouth.   ranitidine 150 MG capsule Commonly known as:  ZANTAC Take 1 capsule (150 mg total) by mouth 2 (two) times daily.      Outpatient follow up:  Postpartum contraception: condoms  Discharged Condition: stable  Discharged to: home    Vanessa DurhamJenkins Michelle Raylon Lamson,CNM [facility], [facility]

## 2017-08-11 NOTE — Discharge Summary (Signed)
Obstetric Discharge Summary  Patient ID: Theresa Knight MRN: [id number] DOB/AGE: 23-24-1997 23 y.o.   Date of Admission: 08/08/2017 Theresa Knight, CNM [facility](D. Evans, MD)  Date of Discharge: 08/09/2017 Theresa Knight, CNM [facility](D. Evans, MD)  Admitting Diagnosis: Observation at 4825w3d     Discharge Diagnosis: No other diagnosis   Antepartum Procedures: NST and Hemorrhoid managment   Brief Hospital Course   L&D OB Triage Note  Theresa Knight is a 22 y.o. G1P0000 female at 4025w3d, EDD Estimated Date of Delivery: 09/02/17 who presented to triage for complaints of regular uterine contractions and hemorrhoid pain.  She was evaluated by the nurses with no significant findings for labor or fetal distress. Vital signs stable. An NST was performed and has been reviewed by CNM. External hemorrhoids elevated by CNM, found to be pink in nature with no signs of decreased blood flow. Lidocaine was applied to anus for symptom management.   NST INTERPRETATION: Indications: rule out uterine contractions  Mode: External Baseline Rate (A): 165 bpm(fht, monitors d/c'd for discharge) Variability: Moderate Accelerations: 15 x 15 Decelerations: None     Contraction Frequency (min): 2-5  Impression: reactive   Plan: NST performed was reviewed and was found to be reactive. She was discharged home with bleeding/labor precautions.  Continue routine prenatal care. Follow up with CNM as previously scheduled.   Discharge Instructions: Per After Visit Summary.  Activity: Refer to After Visit Summary.  Diet: Regular  Medications: Allergies as of 08/09/2017   No Known Allergies     Medication List    STOP taking these medications   metroNIDAZOLE 500 MG tablet Commonly known as:  FLAGYL     TAKE these medications   benzocaine-Menthol 20-0.5 % Aero Commonly known as:  DERMOPLAST Apply 1 application topically 4 (four) times daily as needed for irritation.   cyanocobalamin 1000 MCG/ML  injection Commonly known as:  (VITAMIN B-12) Inject 1 mL (1,000 mcg total) into the muscle every 30 (thirty) days.   FERRALET 90 90-1 MG Tabs Take by mouth.   hydrOXYzine 25 MG tablet Commonly known as:  ATARAX/VISTARIL Take 1 tablet (25 mg total) by mouth every 6 (six) hours as needed (contractions).   Omega 3 1200 MG Caps   prenatal multivitamin Tabs tablet Take 1 tablet daily at 12 noon by mouth.   ranitidine 150 MG capsule Commonly known as:  ZANTAC Take 1 capsule (150 mg total) by mouth 2 (two) times daily.     ASK your doctor about these medications   lidocaine 2 % jelly Commonly known as:  XYLOCAINE Apply 1 application topically once for 1 dose. Ask about: Should I take this medication?      Outpatient follow up:  Follow-up Information    [facility]. Go to.   Why:  Keep regularly scheduled appointments Contact information: [address]  [address] [place] [id number] [phone]         Postpartum contraception: condoms  Discharged Condition: stable  Discharged to: home   Theresa Knight, CNM [facility], [facility]

## 2017-08-14 ENCOUNTER — Ambulatory Visit (INDEPENDENT_AMBULATORY_CARE_PROVIDER_SITE_OTHER): Payer: BLUE CROSS/BLUE SHIELD | Admitting: Obstetrics and Gynecology

## 2017-08-14 VITALS — BP 97/67 | HR 73 | Wt 145.5 lb

## 2017-08-14 DIAGNOSIS — Z3493 Encounter for supervision of normal pregnancy, unspecified, third trimester: Secondary | ICD-10-CM | POA: Diagnosis not present

## 2017-08-14 LAB — POCT URINALYSIS DIPSTICK
Bilirubin, UA: NEGATIVE
Glucose, UA: NEGATIVE
Ketones, UA: NEGATIVE
NITRITE UA: NEGATIVE
PH UA: 6.5 (ref 5.0–8.0)
PROTEIN UA: POSITIVE — AB
Spec Grav, UA: 1.01 (ref 1.010–1.025)
Urobilinogen, UA: 0.2 E.U./dL

## 2017-08-14 NOTE — Progress Notes (Signed)
ROB-reports increase in cramping, and nausea. Labor precaution

## 2017-08-14 NOTE — Progress Notes (Signed)
ROB- pt is having lots of pelvic pressure, some contractions 

## 2017-08-21 ENCOUNTER — Ambulatory Visit (INDEPENDENT_AMBULATORY_CARE_PROVIDER_SITE_OTHER): Payer: BLUE CROSS/BLUE SHIELD | Admitting: Certified Nurse Midwife

## 2017-08-21 VITALS — BP 99/71 | HR 93 | Wt 149.0 lb

## 2017-08-21 DIAGNOSIS — Z3493 Encounter for supervision of normal pregnancy, unspecified, third trimester: Secondary | ICD-10-CM

## 2017-08-21 LAB — POCT URINALYSIS DIPSTICK
BILIRUBIN UA: NEGATIVE
Blood, UA: NEGATIVE
GLUCOSE UA: NEGATIVE
Ketones, UA: NEGATIVE
LEUKOCYTES UA: NEGATIVE
Nitrite, UA: NEGATIVE
Protein, UA: NEGATIVE
Spec Grav, UA: 1.01 (ref 1.010–1.025)
Urobilinogen, UA: 0.2 E.U./dL
pH, UA: 5 (ref 5.0–8.0)

## 2017-08-21 NOTE — Progress Notes (Signed)
Pt is here for an ROB visit. Has had some contractions. Would like to be checked. 

## 2017-08-21 NOTE — Progress Notes (Signed)
ROB-Doing well, except for fatigue. Requests SVE. Discussed home labor preparation techniques. Reviewed red flag symptoms and when to call. RTC x 1 week for ROB or sooner if needed.

## 2017-08-21 NOTE — Patient Instructions (Signed)
Vaginal Delivery Vaginal delivery means that you will give birth by pushing your baby out of your birth canal (vagina). A team of health care providers will help you before, during, and after vaginal delivery. Birth experiences are unique for every woman and every pregnancy, and birth experiences vary depending on where you choose to give birth. What should I do to prepare for my baby's birth? Before your baby is born, it is important to talk with your health care provider about:  Your labor and delivery preferences. These may include: ? Medicines that you may be given. ? How you will manage your pain. This might include non-medical pain relief techniques or injectable pain relief such as epidural analgesia. ? How you and your baby will be monitored during labor and delivery. ? Who may be in the labor and delivery room with you. ? Your feelings about surgical delivery of your baby (cesarean delivery, or C-section) if this becomes necessary. ? Your feelings about receiving donated blood through an IV tube (blood transfusion) if this becomes necessary.  Whether you are able: ? To take pictures or videos of the birth. ? To eat during labor and delivery. ? To move around, walk, or change positions during labor and delivery.  What to expect after your baby is born, such as: ? Whether delayed umbilical cord clamping and cutting is offered. ? Who will care for your baby right after birth. ? Medicines or tests that may be recommended for your baby. ? Whether breastfeeding is supported in your hospital or birth center. ? How long you will be in the hospital or birth center.  How any medical conditions you have may affect your baby or your labor and delivery experience.  To prepare for your baby's birth, you should also:  Attend all of your health care visits before delivery (prenatal visits) as recommended by your health care provider. This is important.  Prepare your home for your baby's  arrival. Make sure that you have: ? Diapers. ? Baby clothing. ? Feeding equipment. ? Safe sleeping arrangements for you and your baby.  Install a car seat in your vehicle. Have your car seat checked by a certified car seat installer to make sure that it is installed safely.  Think about who will help you with your new baby at home for at least the first several weeks after delivery.  What can I expect when I arrive at the birth center or hospital? Once you are in labor and have been admitted into the hospital or birth center, your health care provider may:  Review your pregnancy history and any concerns you have.  Insert an IV tube into one of your veins. This is used to give you fluids and medicines.  Check your blood pressure, pulse, temperature, and heart rate (vital signs).  Check whether your bag of water (amniotic sac) has broken (ruptured).  Talk with you about your birth plan and discuss pain control options.  Monitoring Your health care provider may monitor your contractions (uterine monitoring) and your baby's heart rate (fetal monitoring). You may need to be monitored:  Often, but not continuously (intermittently).  All the time or for long periods at a time (continuously). Continuous monitoring may be needed if: ? You are taking certain medicines, such as medicine to relieve pain or make your contractions stronger. ? You have pregnancy or labor complications.  Monitoring may be done by:  Placing a special stethoscope or a handheld monitoring device on your abdomen to   check your baby's heartbeat, and feeling your abdomen for contractions. This method of monitoring does not continuously record your baby's heartbeat or your contractions.  Placing monitors on your abdomen (external monitors) to record your baby's heartbeat and the frequency and length of contractions. You may not have to wear external monitors all the time.  Placing monitors inside of your uterus  (internal monitors) to record your baby's heartbeat and the frequency, length, and strength of your contractions. ? Your health care provider may use internal monitors if he or she needs more information about the strength of your contractions or your baby's heart rate. ? Internal monitors are put in place by passing a thin, flexible wire through your vagina and into your uterus. Depending on the type of monitor, it may remain in your uterus or on your baby's head until birth. ? Your health care provider will discuss the benefits and risks of internal monitoring with you and will ask for your permission before inserting the monitors.  Telemetry. This is a type of continuous monitoring that can be done with external or internal monitors. Instead of having to stay in bed, you are able to move around during telemetry. Ask your health care provider if telemetry is an option for you.  Physical exam Your health care provider may perform a physical exam. This may include:  Checking whether your baby is positioned: ? With the head toward your vagina (head-down). This is most common. ? With the head toward the top of your uterus (head-up or breech). If your baby is in a breech position, your health care provider may try to turn your baby to a head-down position so you can deliver vaginally. If it does not seem that your baby can be born vaginally, your provider may recommend surgery to deliver your baby. In rare cases, you may be able to deliver vaginally if your baby is head-up (breech delivery). ? Lying sideways (transverse). Babies that are lying sideways cannot be delivered vaginally.  Checking your cervix to determine: ? Whether it is thinning out (effacing). ? Whether it is opening up (dilating). ? How low your baby has moved into your birth canal.  What are the three stages of labor and delivery?  Normal labor and delivery is divided into the following three stages: Stage 1  Stage 1 is the  longest stage of labor, and it can last for hours or days. Stage 1 includes: ? Early labor. This is when contractions may be irregular, or regular and mild. Generally, early labor contractions are more than 10 minutes apart. ? Active labor. This is when contractions get longer, more regular, more frequent, and more intense. ? The transition phase. This is when contractions happen very close together, are very intense, and may last longer than during any other part of labor.  Contractions generally feel mild, infrequent, and irregular at first. They get stronger, more frequent (about every 2-3 minutes), and more regular as you progress from early labor through active labor and transition.  Many women progress through stage 1 naturally, but you may need help to continue making progress. If this happens, your health care provider may talk with you about: ? Rupturing your amniotic sac if it has not ruptured yet. ? Giving you medicine to help make your contractions stronger and more frequent.  Stage 1 ends when your cervix is completely dilated to 4 inches (10 cm) and completely effaced. This happens at the end of the transition phase. Stage 2  Once   your cervix is completely effaced and dilated to 4 inches (10 cm), you may start to feel an urge to push. It is common for the body to naturally take a rest before feeling the urge to push, especially if you received an epidural or certain other pain medicines. This rest period may last for up to 1-2 hours, depending on your unique labor experience.  During stage 2, contractions are generally less painful, because pushing helps relieve contraction pain. Instead of contraction pain, you may feel stretching and burning pain, especially when the widest part of your baby's head passes through the vaginal opening (crowning).  Your health care provider will closely monitor your pushing progress and your baby's progress through the vagina during stage 2.  Your  health care provider may massage the area of skin between your vaginal opening and anus (perineum) or apply warm compresses to your perineum. This helps it stretch as the baby's head starts to crown, which can help prevent perineal tearing. ? In some cases, an incision may be made in your perineum (episiotomy) to allow the baby to pass through the vaginal opening. An episiotomy helps to make the opening of the vagina larger to allow more room for the baby to fit through.  It is very important to breathe and focus so your health care provider can control the delivery of your baby's head. Your health care provider may have you decrease the intensity of your pushing, to help prevent perineal tearing.  After delivery of your baby's head, the shoulders and the rest of the body generally deliver very quickly and without difficulty.  Once your baby is delivered, the umbilical cord may be cut right away, or this may be delayed for 1-2 minutes, depending on your baby's health. This may vary among health care providers, hospitals, and birth centers.  If you and your baby are healthy enough, your baby may be placed on your chest or abdomen to help maintain the baby's temperature and to help you bond with each other. Some mothers and babies start breastfeeding at this time. Your health care team will dry your baby and help keep your baby warm during this time.  Your baby may need immediate care if he or she: ? Showed signs of distress during labor. ? Has a medical condition. ? Was born too early (prematurely). ? Had a bowel movement before birth (meconium). ? Shows signs of difficulty transitioning from being inside the uterus to being outside of the uterus. If you are planning to breastfeed, your health care team will help you begin a feeding. Stage 3  The third stage of labor starts immediately after the birth of your baby and ends after you deliver the placenta. The placenta is an organ that develops  during pregnancy to provide oxygen and nutrients to your baby in the womb.  Delivering the placenta may require some pushing, and you may have mild contractions. Breastfeeding can stimulate contractions to help you deliver the placenta.  After the placenta is delivered, your uterus should tighten (contract) and become firm. This helps to stop bleeding in your uterus. To help your uterus contract and to control bleeding, your health care provider may: ? Give you medicine by injection, through an IV tube, by mouth, or through your rectum (rectally). ? Massage your abdomen or perform a vaginal exam to remove any blood clots that are left in your uterus. ? Empty your bladder by placing a thin, flexible tube (catheter) into your bladder. ? Encourage   you to breastfeed your baby. After labor is over, you and your baby will be monitored closely to ensure that you are both healthy until you are ready to go home. Your health care team will teach you how to care for yourself and your baby. This information is not intended to replace advice given to you by your health care provider. Make sure you discuss any questions you have with your health care provider. Document Released: 11/06/2007 Document Revised: 08/17/2015 Document Reviewed: 02/11/2015 Elsevier Interactive Patient Education  2018 [employer]  

## 2017-08-27 ENCOUNTER — Other Ambulatory Visit: Payer: Self-pay

## 2017-08-27 ENCOUNTER — Inpatient Hospital Stay
Admission: EM | Admit: 2017-08-27 | Discharge: 2017-08-29 | DRG: 807 | Disposition: A | Discharge status: Home / Self Care | Payer: Managed Care, Other (non HMO) | Attending: Certified Nurse Midwife | Admitting: Certified Nurse Midwife

## 2017-08-27 ENCOUNTER — Telehealth: Payer: Self-pay | Admitting: Obstetrics and Gynecology

## 2017-08-27 ENCOUNTER — Ambulatory Visit (INDEPENDENT_AMBULATORY_CARE_PROVIDER_SITE_OTHER): Payer: BLUE CROSS/BLUE SHIELD | Admitting: Certified Nurse Midwife

## 2017-08-27 DIAGNOSIS — Z3A39 39 weeks gestation of pregnancy: Secondary | ICD-10-CM

## 2017-08-27 DIAGNOSIS — Z3483 Encounter for supervision of other normal pregnancy, third trimester: Secondary | ICD-10-CM | POA: Diagnosis present

## 2017-08-27 DIAGNOSIS — Z3A38 38 weeks gestation of pregnancy: Secondary | ICD-10-CM

## 2017-08-27 LAB — TYPE AND SCREEN
ABO/RH(D): O POS
ANTIBODY SCREEN: NEGATIVE

## 2017-08-27 LAB — CBC
HCT: 36.7 % (ref 35.0–47.0)
Hemoglobin: 13 g/dL (ref 12.0–16.0)
MCH: 34.4 pg — ABNORMAL HIGH (ref 26.0–34.0)
MCHC: 35.4 g/dL (ref 32.0–36.0)
MCV: 97.2 fL (ref 80.0–100.0)
Platelets: 232 10*3/uL (ref 150–440)
RBC: 3.78 MIL/uL — ABNORMAL LOW (ref 3.80–5.20)
RDW: 13.5 % (ref 11.5–14.5)
WBC: 10.6 10*3/uL (ref 3.6–11.0)

## 2017-08-27 MED ORDER — LIDOCAINE HCL (PF) 1 % IJ SOLN
INTRAMUSCULAR | Status: AC
  Filled 2017-08-27: qty 30

## 2017-08-27 MED ORDER — ACETAMINOPHEN 325 MG PO TABS: 650.0000 mg | ORAL_TABLET | ORAL | Status: DC | PRN

## 2017-08-27 MED ORDER — OXYTOCIN 10 UNIT/ML IJ SOLN
INTRAMUSCULAR | Status: DC
Start: 2017-08-27 — End: 2017-08-28
  Filled 2017-08-27: qty 2

## 2017-08-27 MED ORDER — OXYTOCIN 40 UNITS IN LACTATED RINGERS INFUSION - SIMPLE MED
INTRAVENOUS | Status: AC
  Administered 2017-08-28: 999 mL/h
  Filled 2017-08-27: qty 1000

## 2017-08-27 MED ORDER — OXYTOCIN 10 UNIT/ML IJ SOLN: 10.0000 [IU] | Freq: Once | INTRAMUSCULAR | Status: DC

## 2017-08-27 MED ORDER — OXYCODONE-ACETAMINOPHEN 5-325 MG PO TABS: 1.0000 | ORAL_TABLET | ORAL | Status: DC | PRN

## 2017-08-27 MED ORDER — SOD CITRATE-CITRIC ACID 500-334 MG/5ML PO SOLN: 30.0000 mL | ORAL | Status: DC | PRN

## 2017-08-27 MED ORDER — ONDANSETRON HCL 4 MG/2ML IJ SOLN: 4.0000 mg | Freq: Four times a day (QID) | INTRAMUSCULAR | Status: DC | PRN

## 2017-08-27 MED ORDER — BUTORPHANOL TARTRATE 2 MG/ML IJ SOLN
INTRAMUSCULAR | Status: AC
  Administered 2017-08-27: 1 mg via INTRAVENOUS
  Filled 2017-08-27: qty 1

## 2017-08-27 MED ORDER — LIDOCAINE HCL (PF) 1 % IJ SOLN
30.0000 mL | INTRAMUSCULAR | Status: DC | PRN
Start: 2017-08-27 — End: 2017-08-29

## 2017-08-27 MED ORDER — AMMONIA AROMATIC IN INHA
RESPIRATORY_TRACT | Status: DC
Start: 2017-08-27 — End: 2017-08-28
  Filled 2017-08-27: qty 10

## 2017-08-27 MED ORDER — LACTATED RINGERS IV SOLN: 500.0000 mL | INTRAVENOUS | Status: DC | PRN

## 2017-08-27 MED ORDER — BUTORPHANOL TARTRATE 1 MG/ML IJ SOLN
1.0000 mg | INTRAMUSCULAR | Status: DC | PRN
  Administered 2017-08-27 (×2): 1 mg via INTRAVENOUS

## 2017-08-27 MED ORDER — MISOPROSTOL 200 MCG PO TABS
ORAL_TABLET | ORAL | Status: AC
  Filled 2017-08-27: qty 4

## 2017-08-27 MED ORDER — OXYCODONE-ACETAMINOPHEN 5-325 MG PO TABS: 2.0000 | ORAL_TABLET | ORAL | Status: DC | PRN

## 2017-08-27 NOTE — Progress Notes (Signed)
Labor check-reports contractions since yesterday afternoon. SVE: 5-6/70/-2, vtx. Limited assessment. Still desires waterbirth. Pt to birthing suites for admission. RTC x 6 weeks for PPV with JML. Labor & Delivery RN, Tresa EndoKelly, notified of patient admission and plan. On call midwife and nurse notified.

## 2017-08-27 NOTE — Telephone Encounter (Signed)
Called pt appt made for 08/28/17

## 2017-08-27 NOTE — Progress Notes (Signed)
LABOR NOTE   Theresa Knight 22 y.o.@ at 3680w1d Early latent labor.  SUBJECTIVE:  Feels contractions every 5-6 mins. Uncomfortable with them.  OBJECTIVE:  BP 118/62 (BP Location: Right Arm)   Pulse 87   Temp 98.2 F (36.8 C) (Oral)   Resp 16   Ht 5\' 1"  (1.549 m)   Wt 150 lb (68 kg)   LMP 11/26/2016   BMI 28.34 kg/m  No intake/output data recorded.  She has not shown cervical change. CERVIX: 5cm:  70%:   -2:   Posterior: easily comes forward with exam.  firm SVE:   Dilation: 5 Effacement (%): 70 Station: -2 Exam by:: [name] CNM CONTRACTIONS: irregular, every 5-6 minutes FHR: Fetal heart doppler ausculted heart tones after AROM 130 with accel.  Analgesia: Water tub, pt entered tub after AROM @ 1252  Labs: Lab Results  Component Value Date   WBC 10.6 08/27/2017   HGB 13.0 08/27/2017   HCT 36.7 08/27/2017   MCV 97.2 08/27/2017   PLT 232 08/27/2017    ASSESSMENT: 1) Labor curve reviewed.       Progress: Early latent labor.     Membranes: ruptured, clear fluid           Active Problems:   Indication for care in labor and delivery, antepartum   PLAN: continue present management   Doreene Burkennie Dhani Imel, CNM  08/27/2017 1:24 PM

## 2017-08-27 NOTE — Progress Notes (Signed)
LABOR NOTE   Theresa Knight 22 y.o.@ at 4469w1d Early latent labor.  SUBJECTIVE:  More uncomfortable, having increased pressure.  OBJECTIVE:  BP (!) 116/56 (BP Location: Right Arm)   Pulse 67   Temp 98 F (36.7 C) (Oral)   Resp 14   Ht 5\' 1"  (1.549 m)   Wt 150 lb (68 kg)   LMP 11/26/2016   BMI 28.34 kg/m  No intake/output data recorded.  She has not shown cervical change. CERVIX: 5-6 cm:  75%:   -2:   posterior:   soft SVE:   Dilation: 5.5 Effacement (%): 80 Station: -2 Exam by:: [name], CNM CONTRACTIONS: regular, every 2-4 minutes FHR: Fetal heart tracing reviewed. Baseline: 130 bpm, Variability: Good {> 6 bpm), Accelerations: Reactive and Decelerations: Absent Category I   Analgesia: Water tub  Labs: Lab Results  Component Value Date   WBC 10.6 08/27/2017   HGB 13.0 08/27/2017   HCT 36.7 08/27/2017   MCV 97.2 08/27/2017   PLT 232 08/27/2017    ASSESSMENT: 1) Labor curve reviewed.       Progress: Early latent labor.     Membranes: ruptured, clear fluid       Active Problems:   Indication for care in labor and delivery, antepartum   PLAN: continue present management Discussed use of nipple stimulation. Pt is considering.   Doreene Burkennie Donnie Gedeon, CNM  08/27/2017 7:12 PM

## 2017-08-27 NOTE — H&P (Signed)
History and Physical   HPI  Theresa Knight is a 22 y.o. G1P0000 at [redacted]w[redacted]d Estimated Date of Delivery: 09/02/17 who is being admitted for labor management. Plans water birth. Class attended.    OB History  OB History  Gravida Para Term Preterm AB Living  1 0 0 0 0 0  SAB TAB Ectopic Multiple Live Births  0 0 0 0 0    # Outcome Date GA Lbr Len/2nd Weight Sex Delivery Anes PTL Lv  1 Current             PROBLEM LIST  Pregnancy complications or risks: Patient Active Problem List   Diagnosis Date Noted  . Pregnancy 08/08/2017  . Labor and delivery, indication for care 07/26/2017  . Indication for care in labor and delivery, antepartum 07/26/2017  . Family history of endometriosis in first degree relative 05/14/2016  . PCO (polycystic ovaries) 05/14/2016  . Dysmenorrhea 05/14/2016  . Dyspareunia in female 05/14/2016  . Irregular menses 05/14/2016  . Endometriosis 05/14/2016    Prenatal labs and studies: ABO, Rh: O/Positive/-- (12/28 1610) Antibody: Negative (12/28 0929) Rubella: 1.19 (12/28 0929) RPR: Non Reactive (05/03 1227)  HBsAg: Negative (12/28 0929)  HIV: Non Reactive (12/28 0929)  RUE:AVWUJWJX (06/21 1339)   Past Medical History:  Diagnosis Date  . Anemia   . Endometriosis   . GERD (gastroesophageal reflux disease)   . PCOS (polycystic ovarian syndrome)   . 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 Vitamin D deficiency      Past Surgical History:  Procedure Laterality Date  . CYSTOSCOPY N/A 11/10/2014   Procedure: CYSTOSCOPY;  Surgeon: Vena Austria, MD;  Location: [facility] ORS;  Service: Gynecology;  Laterality: N/A;  . KNEE ARTHROSCOPY Bilateral 2009, 2011  . LAPAROSCOPY N/A 11/10/2014   Procedure: LAPAROSCOPY DIAGNOSTIC with biopsies;  Surgeon: Vena Austria, MD;  Location: [facility] ORS;  Service: Gynecology;  Laterality: N/A;  . WISDOM TOOTH EXTRACTION       Medications    Current Discharge Medication List    CONTINUE these medications  which have NOT CHANGED   Details  docusate sodium (COLACE) 250 MG capsule Take 250 mg by mouth daily.    Omega 3 1200 MG CAPS     Prenatal Vit-Fe Fumarate-FA (PRENATAL MULTIVITAMIN) TABS tablet Take 1 tablet daily at 12 noon by mouth.    ranitidine (ZANTAC) 150 MG capsule Take 1 capsule (150 mg total) by mouth 2 (two) times daily. Qty: 90 capsule, Refills: 1         Allergies  Patient has no known allergies.  Review of Systems  Constitutional: negative Eyes: negative Ears, nose, mouth, throat, and face: negative Respiratory: negative Cardiovascular: negative Gastrointestinal: negative Genitourinary:negative Integument/breast: negative Hematologic/lymphatic: negative Musculoskeletal:negative Neurological: negative Behavioral/Psych: negative Endocrine: negative Allergic/Immunologic: negative  Physical Exam  LMP 11/26/2016   Lungs:  CTA B Cardio: RRR  Abd: Soft, gravid, NT Presentation: cephalic EXT: No C/C/ 1+ Edema DTRs: 2+ B CERVIX:  SVE completed in office by M.Lawhorn CNM 5-6 cm/70/-2   See Prenatal records for more detailed PE.     FHR:  Baseline: 130 bpm, Variability: Good {> 6 bpm), Accelerations: Reactive and Decelerations: Absent  Toco: Uterine Contractions: Frequency: Every 5-6 minutes and Duration:  50-80 seconds   Test Results  No results found for this or any previous visit (from the past 24 hour(s)). Group B Strep negative  Assessment   G1P0000 at [redacted]w[redacted]d Estimated Date of Delivery: 09/02/17  The fetus is  reassuring.    Patient Active Problem List   Diagnosis Date Noted  . Pregnancy 08/08/2017  . Labor and delivery, indication for care 07/26/2017  . Indication for care in labor and delivery, antepartum 07/26/2017  . Family history of endometriosis in first degree relative 05/14/2016  . PCO (polycystic ovaries) 05/14/2016  . Dysmenorrhea 05/14/2016  . Dyspareunia in female 05/14/2016  . Irregular menses 05/14/2016  .  Endometriosis 05/14/2016    Plan  1. Admit to L&D :   continue present management 2. EFM:-- Category 1 3. Epidural if desired.  Stadol for IV pain until epidural requested. 4. Admission labs  5. Anticipate NSVD  Doreene Burkennie Jie Stickels, CNM  08/27/2017 11:46 AM

## 2017-08-27 NOTE — Progress Notes (Signed)
Pt is here in possible labor. Contractions about 4 minutes apart since yesterday.

## 2017-08-27 NOTE — Telephone Encounter (Signed)
Patient states she is having bad contractions and is nauseous; wants to get checked.  Appointment tomorrow at 9:30 but prefers to not go to hospital and incur that fee if possible in case it is false alarm.  Not leaking fluid, no vag bleeding.  Patient desires a call back ASAP, please advise, thanks.

## 2017-08-27 NOTE — Progress Notes (Signed)
LABOR NOTE   Theresa Knight 22 y.o.@ at 2859w1d Active phase labor.  SUBJECTIVE:  Complains of urge to push.  OBJECTIVE:  BP 126/72 (BP Location: Left Arm)   Pulse 86   Temp 98.3 F (36.8 C) (Axillary)   Resp 18   Ht 5\' 1"  (1.549 m)   Wt 150 lb (68 kg)   LMP 11/26/2016   SpO2 100%   BMI 28.34 kg/m  No intake/output data recorded.  She has shown cervical change. CERVIX: 9.5:  100%:   0:   mid position:   Soft, bloody show SVE:   Dilation: Lip/rim Effacement (%): 90 Station: 0 Exam by:: [name] CNM CONTRACTIONS: regular, every 2-4 minutes FHR: Fetal heart tracing reviewed. Baseline: 120 bpm, Variability: Good {> 6 bpm), Accelerations: Reactive and Decelerations: variable Category II  Analgesia: IV pain meds  Labs: Lab Results  Component Value Date   WBC 10.6 08/27/2017   HGB 13.0 08/27/2017   HCT 36.7 08/27/2017   MCV 97.2 08/27/2017   PLT 232 08/27/2017    ASSESSMENT: 1) Labor curve reviewed.       Progress: Active phase labor.     Membranes: ruptured, clear fluid          Active Problems:   Indication for care in labor and delivery, antepartum   PLAN: continue present management  [name], CNM 08/27/2017 10:42 PM

## 2017-08-27 NOTE — Progress Notes (Signed)
LABOR NOTE   Theresa Knight 22 y.o.@ at 4664w1d Active phase labor.  SUBJECTIVE:  More uncomfortable, feels urge to have bowel movement. Request SVE OBJECTIVE:  BP (!) 132/92 (BP Location: Right Arm)   Pulse 86   Temp 98.2 F (36.8 C) (Oral)   Resp 18   Ht 5\' 1"  (1.549 m)   Wt 150 lb (68 kg)   LMP 11/26/2016   BMI 28.34 kg/m  No intake/output data recorded.  She has shown cervical change. CERVIX: 7-8cm:  100%:   -2:   mid position:   soft SVE:   Dilation: 7.5 Effacement (%): 90 Station: -2 Exam by:: [name] CNM CONTRACTIONS: regular, every 2-4 minutes FHR: Fetal heart tracing reviewed. Baseline: 125 bpm, Variability: Good {> 6 bpm), Accelerations: Reactive and Decelerations: Absent Category I  Analgesia: nitrous oxied then IV pain medications  Labs: Lab Results  Component Value Date   WBC 10.6 08/27/2017   HGB 13.0 08/27/2017   HCT 36.7 08/27/2017   MCV 97.2 08/27/2017   PLT 232 08/27/2017    ASSESSMENT: 1) Labor curve reviewed.       Progress: Active phase labor.     Membranes: ruptured, clear fluid           Active Problems:   Indication for care in labor and delivery, antepartum   PLAN: continue present management  Theresa Knight, CNM  08/27/2017 8:46 PM

## 2017-08-28 ENCOUNTER — Encounter: Payer: BLUE CROSS/BLUE SHIELD | Admitting: Certified Nurse Midwife

## 2017-08-28 DIAGNOSIS — Z3A39 39 weeks gestation of pregnancy: Secondary | ICD-10-CM

## 2017-08-28 LAB — CBC
HEMATOCRIT: 35.5 % (ref 35.0–47.0)
Hemoglobin: 12.5 g/dL (ref 12.0–16.0)
MCH: 34.2 pg — AB (ref 26.0–34.0)
MCHC: 35.3 g/dL (ref 32.0–36.0)
MCV: 96.7 fL (ref 80.0–100.0)
Platelets: 225 10*3/uL (ref 150–440)
RBC: 3.67 MIL/uL — AB (ref 3.80–5.20)
RDW: 13.2 % (ref 11.5–14.5)
WBC: 20.7 10*3/uL — ABNORMAL HIGH (ref 3.6–11.0)

## 2017-08-28 LAB — RPR: RPR Ser Ql: NONREACTIVE

## 2017-08-28 MED ORDER — OXYCODONE-ACETAMINOPHEN 5-325 MG PO TABS: 2.0000 | ORAL_TABLET | ORAL | Status: DC | PRN

## 2017-08-28 MED ORDER — ONDANSETRON HCL 4 MG PO TABS: 4.0000 mg | ORAL_TABLET | ORAL | Status: DC | PRN

## 2017-08-28 MED ORDER — METHYLERGONOVINE MALEATE 0.2 MG/ML IJ SOLN: 0.2000 mg | INTRAMUSCULAR | Status: DC | PRN

## 2017-08-28 MED ORDER — PRENATAL MULTIVITAMIN CH: 1.0000 | ORAL_TABLET | Freq: Every day | ORAL | Status: DC

## 2017-08-28 MED ORDER — FERROUS SULFATE 325 (65 FE) MG PO TABS: 325.0000 mg | ORAL_TABLET | Freq: Every day | ORAL | Status: DC

## 2017-08-28 MED ORDER — SIMETHICONE 80 MG PO CHEW: 80.0000 mg | CHEWABLE_TABLET | ORAL | Status: DC | PRN

## 2017-08-28 MED ORDER — DOCUSATE SODIUM 100 MG PO CAPS: 100.0000 mg | ORAL_CAPSULE | Freq: Two times a day (BID) | ORAL | Status: DC

## 2017-08-28 MED ORDER — BENZOCAINE-MENTHOL 20-0.5 % EX AERO
1.0000 | INHALATION_SPRAY | CUTANEOUS | Status: DC | PRN
Start: 2017-08-28 — End: 2017-08-29

## 2017-08-28 MED ORDER — SENNOSIDES-DOCUSATE SODIUM 8.6-50 MG PO TABS: 2.0000 | ORAL_TABLET | ORAL | Status: DC

## 2017-08-28 MED ORDER — DIBUCAINE 1 % RE OINT: 1.0000 "application " | TOPICAL_OINTMENT | RECTAL | Status: DC | PRN

## 2017-08-28 MED ORDER — METHYLERGONOVINE MALEATE 0.2 MG PO TABS: 0.2000 mg | ORAL_TABLET | ORAL | Status: DC | PRN

## 2017-08-28 MED ORDER — ACETAMINOPHEN 325 MG PO TABS: 650.0000 mg | ORAL_TABLET | ORAL | Status: DC | PRN

## 2017-08-28 MED ORDER — WITCH HAZEL-GLYCERIN EX PADS: 1.0000 "application " | MEDICATED_PAD | CUTANEOUS | Status: DC | PRN

## 2017-08-28 MED ORDER — IBUPROFEN 600 MG PO TABS: 600.0000 mg | ORAL_TABLET | Freq: Four times a day (QID) | ORAL | Status: DC

## 2017-08-28 MED ORDER — ONDANSETRON HCL 4 MG/2ML IJ SOLN: 4.0000 mg | INTRAMUSCULAR | Status: DC | PRN

## 2017-08-28 MED ORDER — TETANUS-DIPHTH-ACELL PERTUSSIS 5-2.5-18.5 LF-MCG/0.5 IM SUSP: 0.5000 mL | Freq: Once | INTRAMUSCULAR | Status: DC

## 2017-08-28 MED ORDER — OXYCODONE-ACETAMINOPHEN 5-325 MG PO TABS: 1.0000 | ORAL_TABLET | ORAL | Status: DC | PRN

## 2017-08-28 MED ORDER — COCONUT OIL OIL
1.0000 "application " | TOPICAL_OIL | Status: DC | PRN
  Administered 2017-08-28: 1 via TOPICAL
  Filled 2017-08-28: qty 120

## 2017-08-28 NOTE — Lactation Note (Signed)
This note was copied from a baby's chart. Lactation Consultation Note  Patient Name: Theresa Knight [id number]'s Date: 08/28/2017 Reason for consult: Initial assessment   Maternal Data    Feeding Feeding Type: Breast Fed Length of feed: 20 min  LATCH Score Latch: Repeated attempts needed to sustain latch, nipple held in mouth throughout feeding, stimulation needed to elicit sucking reflex.  Audible Swallowing: A few with stimulation  Type of Nipple: Everted at rest and after stimulation  Comfort (Breast/Nipple): Filling, red/small blisters or bruises, mild/mod discomfort  Hold (Positioning): Assistance needed to correctly position infant at breast and maintain latch.  LATCH Score: 6  Interventions Interventions: Breast feeding basics reviewed;Assisted with latch;Adjust position;Support pillows;Coconut oil;Comfort gels  Lactation Tools Discussed/Used     Consult Status  LC called to the room to assist with latch. Nurse states that infant's bottom lip is not flanged well when attempting to latch. LC assisted with latch and encouraged mother to hand express some colostrum before latching infant. Infant was able to latch and LC taught the mom to flange the bottom lip due to infant tucking that lip in.    Theresa Knight 08/28/2017, 12:28 PM

## 2017-08-28 NOTE — Lactation Note (Signed)
This note was copied from a baby's chart. Lactation Consultation Note  Patient Name: Theresa Knight [id number]'s Date: 08/28/2017 Reason for consult: Follow-up assessment   Maternal Data    Feeding Feeding Type: Breast Fed Length of feed: 10 min  LATCH Score Latch: Repeated attempts needed to sustain latch, nipple held in mouth throughout feeding, stimulation needed to elicit sucking reflex.  Audible Swallowing: A few with stimulation  Type of Nipple: Everted at rest and after stimulation  Comfort (Breast/Nipple): Filling, red/small blisters or bruises, mild/mod discomfort  Hold (Positioning): No assistance needed to correctly position infant at breast.  LATCH Score: 7  Interventions Interventions: Support pillows;Adjust position  Lactation Tools Discussed/Used Tools: Nipple Dorris CarnesShields   Consult Status  Mother called for assistance with latching infant. Mother states that her nipples are sore and has a bruise on her right areola. Attempted to latch infant using the football hold on her left breast. Infant could not maintain latch inititally and LC had to pull chin down to flange lips multiple times but after using a nipple shield infant was able to feed for 5 minutes with audible swallows heard. Mother states that infant would fall asleep and slide down the nipple and would clamp down when she stimulated him to wake causing her pain. LC switched infant to the right breast and changed positions to the football hold. Infant fed on the right side using nipple shield for 10 minutes with audible swallows heard and fell asleep. Mother placed infant skin to skin.     Arlyss Gandylicia Lerin Jech 08/28/2017, 3:50 PM

## 2017-08-29 NOTE — Progress Notes (Addendum)
Reviewed D/C instructions with pt and family. Pt verbalized understanding of teaching. Discharged to home via W/C. Pt to schedule f/u appt.  

## 2017-08-29 NOTE — Final Progress Note (Signed)
Discharge Day SOAP Note:  Progress Note - Vaginal Delivery  Theresa Knight is a 22 y.o. G1P1001 now PP day 1 s/p Vaginal, Spontaneous . Delivery was uncomplicated  Subjective  The patient has the following complaints: has no unusual complaints  Pain is controlled with current medications.   Patient is urinating without difficulty.  She is ambulating well.     Objective  Vital signs: BP 120/70   Pulse 83   Temp 98.1 F (36.7 C) (Oral)   Resp 20   Ht 5\' 1"  (1.549 m)   Wt 150 lb (68 kg)   LMP 11/26/2016   SpO2 98%   Breastfeeding? Unknown   BMI 28.34 kg/m   Physical Exam: Gen: NAD Lungs clear bilaterally Heart: RRR NO signs of DVT Fundus Fundal Tone: Firm  Lochia Amount: Small  Perineum Appearance: Intact, Edematous, Hemorrhoid(2 hemorrhoids noted)     Data Review Labs: CBC Latest Ref Rng & Units 08/28/2017 08/27/2017 06/12/2017  WBC 3.6 - 11.0 K/uL 20.7(H) 10.6 7.5  Hemoglobin 12.0 - 16.0 g/dL 40.912.5 81.113.0 91.411.9  Hematocrit 35.0 - 47.0 % 35.5 36.7 34.8  Platelets 150 - 440 K/uL 225 232 276   O POS  Assessment/Plan  Active Problems:   Indication for care in labor and delivery, antepartum    Plan for discharge today.   Discharge Instructions: Per After Visit Summary. Activity: Advance as tolerated. Pelvic rest for 6 weeks.  Also refer to After Visit Summary Diet: Regular Medications: Allergies as of 08/29/2017   No Known Allergies     Medication List    TAKE these medications   docusate sodium 250 MG capsule Commonly known as:  COLACE Take 250 mg by mouth daily.   Omega 3 1200 MG Caps   prenatal multivitamin Tabs tablet Take 1 tablet daily at 12 noon by mouth.   ranitidine 150 MG capsule Commonly known as:  ZANTAC Take 1 capsule (150 mg total) by mouth 2 (two) times daily.      Outpatient follow up:  Postpartum contraception: Plans condom use  Discharged Condition: good  Discharged to: home  Newborn Data: Disposition:home with  mother  Apgars: APGAR (1 MIN): 8   APGAR (5 MINS): 9   APGAR (10 MINS):    Baby Feeding: Breast    Doreene Burkennie [name], CNM  08/29/2017 5:12 AM

## 2017-08-29 NOTE — Discharge Summary (Signed)
Discharge Summary  Date of Admission: 08/27/2017  Date of Discharge: 08/29/2017  Admitting Diagnosis: Onset of Labor at 36102w2d  Mode of Delivery: normal spontaneous vaginal delivery                 Discharge Diagnosis: No other diagnosis   Intrapartum Procedures: Atificial rupture of membranes   Post partum procedures: none  Complications: none                      Discharge Day SOAP Note:  Progress Note - Vaginal Delivery  Theresa Knight is a 22 y.o. G1P1001 now PP day 1 s/p Vaginal, Spontaneous . Delivery was uncomplicated  Subjective  The patient has the following complaints: has no unusual complaints  Pain is controlled with current medications.   Patient is urinating without difficulty.  She is ambulating well.     Objective  Vital signs: BP 120/70   Pulse 83   Temp 98.1 F (36.7 C) (Oral)   Resp 20   Ht 5\' 1"  (1.549 m)   Wt 150 lb (68 kg)   LMP 11/26/2016   SpO2 98%   Breastfeeding? Unknown   BMI 28.34 kg/m   Physical Exam: Gen: NAD Lungs clear bilaterally Heart: RRR NO signs of DVT Fundus Fundal Tone: Firm  Lochia Amount: Small  Perineum Appearance: Intact, Edematous, Hemorrhoid(2 hemorrhoids noted)     Data Review Labs: CBC Latest Ref Rng & Units 08/28/2017 08/27/2017 06/12/2017  WBC 3.6 - 11.0 K/uL 20.7(H) 10.6 7.5  Hemoglobin 12.0 - 16.0 g/dL 54.012.5 98.113.0 19.111.9  Hematocrit 35.0 - 47.0 % 35.5 36.7 34.8  Platelets 150 - 440 K/uL 225 232 276   O POS  Assessment/Plan  Active Problems:   Indication for care in labor and delivery, antepartum    Plan for discharge today.   Discharge Instructions: Per After Visit Summary. Activity: Advance as tolerated. Pelvic rest for 6 weeks.  Also refer to After Visit Summary Diet: Regular Medications: Allergies as of 08/29/2017   No Known Allergies     Medication List    TAKE these medications   docusate sodium 250 MG capsule Commonly known as:  COLACE Take 250 mg by  mouth daily.   Omega 3 1200 MG Caps   prenatal multivitamin Tabs tablet Take 1 tablet daily at 12 noon by mouth.   ranitidine 150 MG capsule Commonly known as:  ZANTAC Take 1 capsule (150 mg total) by mouth 2 (two) times daily.      Outpatient follow up:  Postpartum contraception: Plans condom use  Discharged Condition: good  Discharged to: home  Newborn Data: Disposition:home with mother  Apgars: APGAR (1 MIN): 8   APGAR (5 MINS): 9   APGAR (10 MINS):    Baby Feeding: Breast    Doreene Burkennie Amany Rando, CNM  08/29/2017 5:12 AM

## 2017-08-29 NOTE — Lactation Note (Signed)
This note was copied from a baby's chart. Lactation Consultation Note  Patient Name: Theresa Lyn HenriSavannah Ciresi RUEAV'WToday's Date: 08/29/2017 Reason for consult: Follow-up assessment   Maternal Data    Feeding Feeding Type: Breast Fed Length of feed: 30 min(15 min per side)  LATCH Score Latch: Repeated attempts needed to sustain latch, nipple held in mouth throughout feeding, stimulation needed to elicit sucking reflex.  Audible Swallowing: Spontaneous and intermittent  Type of Nipple: Everted at rest and after stimulation  Comfort (Breast/Nipple): Filling, red/small blisters or bruises, mild/mod discomfort  Hold (Positioning): No assistance needed to correctly position infant at breast.  LATCH Score: 8  Interventions Interventions: Breast feeding basics reviewed;Support pillows;Position options;Expressed milk;DEBP  Lactation Tools Discussed/Used     Consult Status Consult Status: PRN Mother called LC to the room to ask questions about breastfeeding at home before her discharge today. Infant fed on both sides using the nipple shield. Mother states that infant bites at times and her nipples are still sore. LC watched the feeding and noticed that when falling asleep infant will slide down the nipple and and clamp down when he awakens and realize that he is losing the latch which causes pain for mom. LC also notices that infant curls both lips in when latched. Parents were taught to roll the top lip out and pull chin down to correct latch when infant does this. LC answered questions about latching using the nipple shield and questions mother had about pumping and how to use her pump. LC reviewed hunger cues, signs that infant is satisfied and answered questions about pumping as well as showed mother how to use her pump. LC encouraged mother to hold off on pumping until breastfeeding is established. LC noticed a pacifier in infant's crib that was brought in by parents. Mother states that infant  does well with a pacifier. LC educated parents on how to use the pacifier to help infant learn to keep  lips flanged out while sucking. Infant was fed 5 mL of mother's pumped colostrum while sucking on the pacifier. Mother was given information on the lactation outpatient clinic if she has additional questions or concerns after hospital discharge.   Theresa Knight 08/29/2017, 9:33 AM

## 2017-08-30 ENCOUNTER — Encounter: Payer: Self-pay | Admitting: Certified Nurse Midwife

## 2017-09-15 ENCOUNTER — Telehealth: Payer: Self-pay | Admitting: Certified Nurse Midwife

## 2017-09-15 ENCOUNTER — Encounter: Payer: Self-pay | Admitting: Certified Nurse Midwife

## 2017-09-15 ENCOUNTER — Ambulatory Visit: Payer: 59 | Admitting: Neurology

## 2017-09-15 NOTE — Telephone Encounter (Signed)
The patient called asking if she can get some nipple cream.  She was told to get it by her lactation consultant.  Please advise, thanks.

## 2017-09-16 ENCOUNTER — Other Ambulatory Visit: Payer: Self-pay

## 2017-09-16 ENCOUNTER — Ambulatory Visit: Payer: Self-pay

## 2017-09-16 ENCOUNTER — Other Ambulatory Visit: Payer: Self-pay | Admitting: Certified Nurse Midwife

## 2017-09-16 MED ORDER — LANSINOH LANOLIN MINIS NIPPLE EX CREA: 1.0000 [drp] | TOPICAL_CREAM | Freq: Three times a day (TID) | CUTANEOUS | 1 refills | Status: DC | PRN

## 2017-09-16 NOTE — Progress Notes (Signed)
Order placed for nipple cream per pt request.   Doreene BurkeAnnie Donald Memoli, CNM

## 2017-09-16 NOTE — Lactation Note (Signed)
This note was copied from a baby's chart. Lactation Consultation Note  Patient Name: Theresa HoveMicah Everett Speaker Today's Date: 09/16/2017     Maternal Data    Feeding    LATCH Score                   Interventions    Lactation Tools Discussed/Used     Consult Status  Mother and infant were seen for a lactation outpatient consult today. Mother states that infant got his lip tie revised on last Tuesday and she is hearing clicking when infant is breastfeeding. Mother also states that she is experiencing a burning sensation when breastfeeding and also in between feedings that starts intramuscular and radiates around her areola. Mother states that she experienced engorgement during her second week and states that her milk supply dropped before the revision and she is now pumping after every feeding. Mother states that infant was evaluated by Pediatric provider who did not see any signs of thrush. Pre and post weight was obtained before and after the breastfeeding session and was 3858 g (8 lbs 8.1 oz) before the feed and 3930 (8 lbs 10.6 oz) afterwards. Infant took 72 mL during this feeding. Occasional clicking sounds were heard during the feed. When finished, her nipples were round. Plan is as follows: Mother was advised to stop pumping after feeds due to having an abundant milk supply. The burning sensation felt is mostly likely due to the sensation of her milk "letting down." It was also suggested that she change the position of infant during feeds to more up right (a reclined football hold) to decrease gulping and choking during let down. Mother was advised to continue post op lip stretches and flanging infant's lips out during breastfeeding and when using the bottle and pacifier to train infant to suck with lips flanged. Mother felt good about this plan and denies further questions. LC suggested that mother call back if she does have any additional concerns.    Arlyss Gandylicia Kailyn Vanderslice 09/16/2017,  11:06 AM

## 2017-09-21 ENCOUNTER — Telehealth: Payer: Self-pay

## 2017-09-21 NOTE — Telephone Encounter (Signed)
Message left on voicemail confirming pt got script for nipple cream. Mychart message also sent.

## 2017-10-09 ENCOUNTER — Ambulatory Visit (INDEPENDENT_AMBULATORY_CARE_PROVIDER_SITE_OTHER): Payer: BLUE CROSS/BLUE SHIELD | Admitting: Certified Nurse Midwife

## 2017-10-09 NOTE — Patient Instructions (Signed)

## 2017-10-09 NOTE — Progress Notes (Signed)
Subjective:    Theresa Knight is a 22 y.o. 311P1001 Caucasian female who presents for a postpartum visit. She is 6 weeks postpartum following a spontaneous vaginal delivery at 39.2 gestational weeks. Anesthesia: none. I have fully reviewed the prenatal and intrapartum course. Postpartum course has been WNL. Baby's course has been WNL Baby is feeding by breast. Bleeding staining only. Bowel function is normal. Bladder function is normal. Patient is sexually active. She attemted a few days ago but state she was only able to get tip in due to pain.  Contraception method is condoms. Postpartum depression screening: negative. Score 0.  Last pap 01/29/2016 and was negative.  The following portions of the patient's history were reviewed and updated as appropriate: allergies, current medications, past medical history, past surgical history and problem list.  Review of Systems Pertinent items are noted in HPI.   Vitals:   10/09/17 0948  BP: 119/81  Pulse: 70  Weight: 61.8 kg  Height: 5\' 1"  (1.549 m)   Patient's last menstrual period was 11/26/2016.  Objective:   General:  alert, cooperative and no distress   Breasts:  deferred, no complaints  Lungs: clear to auscultation bilaterally  Heart:  regular rate and rhythm  Abdomen: soft, nontender   Vulva: normal  Vagina: normal vagina  Cervix:  closed  Corpus: Well-involuted  Adnexa:  Non-palpable  Rectal Exam:  small non inflammed hemorrhoids        Assessment:   Postpartum exam 6 wks s/p SVD Breastfeeding Depression screening Contraception counseling   Plan:  : condoms. Discussed use of lubrication and toys to help stretch tissue. Pain is felt with pressure on posterior wall of vagina. Encouraged to try to remain with but pressed downward on bed to prevent vaginal muscles from contracting. Pt is to try if not successful she will call and will place order for pelvic floor PT.  Follow up in: 1 year for annual exam or earlier if  needed  Doreene BurkeAnnie Laelyn Blumenthal, CNM   Theresa Knight, CNM

## 2017-10-21 ENCOUNTER — Other Ambulatory Visit: Payer: Self-pay | Admitting: Obstetrics and Gynecology

## 2017-11-02 ENCOUNTER — Telehealth: Payer: Self-pay | Admitting: Lactation Services

## 2017-11-02 NOTE — Telephone Encounter (Signed)
Pt states baby had tongue tie revision latch is now better, has questions about oversupply and let down, positions to feed in, questions anwered, call LC for any further questions or concerns

## 2018-10-08 ENCOUNTER — Telehealth: Payer: Self-pay

## 2018-10-08 NOTE — Telephone Encounter (Signed)
Coronavirus (COVID-19) Are you at risk?  Are you at risk for the Coronavirus (COVID-19)?  To be considered HIGH RISK for Coronavirus (COVID-19), you have to meet the following criteria:  . Traveled to [place], [place], [place], [place] or [place]; or in the [place] to [place], [place], [place], or [place]; and have fever, cough, and shortness of breath within the last 2 weeks of travel OR . Been in close contact with a person diagnosed with COVID-19 within the last 2 weeks and have fever, cough, and shortness of breath . IF YOU DO NOT MEET THESE CRITERIA, YOU ARE CONSIDERED LOW RISK FOR COVID-19.  What to do if you are HIGH RISK for COVID-19?  . If you are having a medical emergency, call 911. . Seek medical care right away. Before you go to a doctor's office, urgent care or emergency department, call ahead and tell them about your recent travel, contact with someone diagnosed with COVID-19, and your symptoms. You should receive instructions from your physician's office regarding next steps of care.  . When you arrive at healthcare provider, tell the healthcare staff immediately you have returned from visiting [place], [place], [place], [place] or [place]; or traveled in the [place] to [place], [place], [place], or [place]; in the last two weeks or you have been in close contact with a person diagnosed with COVID-19 in the last 2 weeks.   . Tell the health care staff about your symptoms: fever, cough and shortness of breath. . After you have been seen by a medical provider, you will be either: o Tested for (COVID-19) and discharged home on quarantine except to seek medical care if symptoms worsen, and asked to  - Stay home and avoid contact with others until you get your results (4-5 days)  - Avoid travel on public transportation if possible (such as bus, train, or airplane) or o Sent to the Emergency Department by EMS for evaluation, COVID-19 testing, and possible  admission depending on your condition and test results.  What to do if you are LOW RISK for COVID-19?  Reduce your risk of any infection by using the same precautions used for avoiding the common cold or flu:  . Wash your hands often with soap and warm water for at least 20 seconds.  If soap and water are not readily available, use an alcohol-based hand sanitizer with at least 60% alcohol.  . If coughing or sneezing, cover your mouth and nose by coughing or sneezing into the elbow areas of your shirt or coat, into a tissue or into your sleeve (not your hands). . Avoid shaking hands with others and consider head nods or verbal greetings only. . Avoid touching your eyes, nose, or mouth with unwashed hands.  . Avoid close contact with people who are Theresa Knight. . Avoid places or events with large numbers of people in one location, like concerts or sporting events. . Carefully consider travel plans you have or are making. . If you are planning any travel outside or inside the [place], visit the CDC's Travelers' Health webpage for the latest health notices. . If you have some symptoms but not all symptoms, continue to monitor at home and seek medical attention if your symptoms worsen. . If you are having a medical emergency, call 911.  10/08/18 SCREENING NEG SLS ADDITIONAL HEALTHCARE OPTIONS FOR PATIENTS  [facility] / e-Visit: [email or web address]         [facility]: [phone]    [facility]: [phone]                   [facility]: [phone]  

## 2018-10-10 ENCOUNTER — Other Ambulatory Visit: Payer: Self-pay | Admitting: Certified Nurse Midwife

## 2018-10-10 ENCOUNTER — Encounter: Payer: Self-pay | Admitting: Certified Nurse Midwife

## 2018-10-10 DIAGNOSIS — M545 Low back pain, unspecified: Secondary | ICD-10-CM | POA: Insufficient documentation

## 2018-10-10 DIAGNOSIS — N941 Unspecified dyspareunia: Secondary | ICD-10-CM

## 2018-10-11 ENCOUNTER — Encounter: Payer: BLUE CROSS/BLUE SHIELD | Admitting: Certified Nurse Midwife

## 2018-10-28 ENCOUNTER — Other Ambulatory Visit: Payer: Self-pay

## 2018-10-28 ENCOUNTER — Other Ambulatory Visit (HOSPITAL_COMMUNITY)
Admission: RE | Admit: 2018-10-28 | Discharge: 2018-10-28 | Disposition: A | Discharge status: Home / Self Care | Payer: Managed Care, Other (non HMO) | Source: Ambulatory Visit | Attending: Certified Nurse Midwife | Admitting: Certified Nurse Midwife

## 2018-10-28 ENCOUNTER — Encounter: Payer: Self-pay | Admitting: Certified Nurse Midwife

## 2018-10-28 ENCOUNTER — Ambulatory Visit (INDEPENDENT_AMBULATORY_CARE_PROVIDER_SITE_OTHER): Payer: Managed Care, Other (non HMO) | Admitting: Certified Nurse Midwife

## 2018-10-28 VITALS — BP 106/78 | HR 68 | Ht 61.0 in | Wt 130.5 lb

## 2018-10-28 DIAGNOSIS — Z124 Encounter for screening for malignant neoplasm of cervix: Secondary | ICD-10-CM | POA: Insufficient documentation

## 2018-10-28 DIAGNOSIS — N940 Mittelschmerz: Secondary | ICD-10-CM | POA: Diagnosis not present

## 2018-10-28 DIAGNOSIS — Z789 Other specified health status: Secondary | ICD-10-CM | POA: Diagnosis not present

## 2018-10-28 DIAGNOSIS — N941 Unspecified dyspareunia: Secondary | ICD-10-CM

## 2018-10-28 DIAGNOSIS — N809 Endometriosis, unspecified: Secondary | ICD-10-CM

## 2018-10-28 DIAGNOSIS — Z01419 Encounter for gynecological examination (general) (routine) without abnormal findings: Secondary | ICD-10-CM | POA: Insufficient documentation

## 2018-10-28 HISTORY — DX: Other specified health status: Z78.9

## 2018-10-28 NOTE — Progress Notes (Signed)
Patient here for annual exam, no complaints.  

## 2018-10-28 NOTE — Progress Notes (Signed)
ANNUAL PREVENTATIVE CARE GYN  ENCOUNTER NOTE  Subjective:       Theresa Knight is a 23 y.o. 691P1001 female here for a routine annual gynecologic exam.  Current complaints: 1. Needs Pap smear 2. Questions regarding pain with ovulation  Has been following plant based diet since birth of son. 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.   Noticed left sided pain with ovulation yesterday. Planning birth of next baby in 2022.   Denies difficulty breathing or respiratory distress, chest pain, abdominal pain, excessive vaginal bleeding, dysuria, and leg pain or swelling.    Gynecologic History  Patient's last menstrual period was 10/08/2018 (exact date). Period Cycle (Days): 31 Period Duration (Days): 5 Period Pattern: Regular Menstrual Flow: Heavy, Moderate Menstrual Control: Tampon Dysmenorrhea: (!) Moderate Dysmenorrhea Symptoms: Cramping  Contraception: condoms  Last Pap: 01/2016. Results were: normal  Obstetric History  OB History  Gravida Para Term Preterm AB Living  1 1 1  0 0 1  SAB TAB Ectopic Multiple Live Births  0 0 0 0 1    # Outcome Date GA Lbr Len/2nd Weight Sex Delivery Anes PTL Lv  1 Term 08/28/17 [id number] / 01:03 6 lb 15.5 oz (3.16 kg) M Vag-Spont None  LIV    Past Medical History:  Diagnosis Date  . Anemia   . Endometriosis   . GERD (gastroesophageal reflux disease)   . PCOS (polycystic ovarian syndrome)   . 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 Vitamin D deficiency     Past Surgical History:  Procedure Laterality Date  . CYSTOSCOPY N/A 11/10/2014   Procedure: CYSTOSCOPY;  Surgeon: Vena AustriaAndreas Staebler, MD;  Location: [facility] ORS;  Service: Gynecology;  Laterality: N/A;  . KNEE ARTHROSCOPY Bilateral 2009, 2011  . LAPAROSCOPY N/A 11/10/2014   Procedure: LAPAROSCOPY DIAGNOSTIC with biopsies;  Surgeon: Vena AustriaAndreas Staebler, MD;  Location: [facility] ORS;  Service: Gynecology;  Laterality: N/A;  . WISDOM TOOTH EXTRACTION      No current outpatient  medications on file prior to visit.   No current facility-administered medications on file prior to visit.     No Known Allergies  Social History   Socioeconomic History  . Marital status: Married    Spouse name: Not on file  . Number of children: Not on file  . Years of education: Not on file  . Highest education level: Not on file  Occupational History  . Not on file  Social Needs  . Financial resource strain: Not on file  . Food insecurity    Worry: Not on file    Inability: Not on file  . Transportation needs    Medical: Not on file    Non-medical: Not on file  Tobacco Use  . Smoking status: Never Smoker  . Smokeless tobacco: Never Used  Substance and Sexual Activity  . Alcohol use: No  . Drug use: No  . Sexual activity: Yes    Birth control/protection: Condom  Lifestyle  . Physical activity    Days per week: Not on file    Minutes per session: Not on file  . Stress: Not on file  Relationships  . Social Musicianconnections    Talks on phone: Not on file    Gets together: Not on file    Attends religious service: Not on file    Active member of club or organization: Not on file    Attends meetings of clubs or organizations: Not on file    Relationship status: Not  on file  . Intimate partner violence    Fear of current or ex partner: Not on file    Emotionally abused: Not on file    Physically abused: Not on file    Forced sexual activity: Not on file  Other Topics Concern  . Not on file  Social History Narrative   Pt lives in 2 story home with her husband, son, and 2 dogs   Has a general education associates degree   Completed nursing school-academy grad [employer]    Family History  Problem Relation Age of Onset  . Diabetes Paternal Grandmother   . Heart disease Paternal Grandmother   . Breast cancer Neg Hx   . Ovarian cancer Neg Hx   . Colon cancer Neg Hx     The following portions of the patient's history were reviewed and updated as  appropriate: allergies, current medications, past family history, past medical history, past social history, past surgical history and problem list.  Review of Systems  ROS negative except as noted above. Information obtained from patient.    Objective:   BP 106/78   Pulse 68   Ht 5\' 1"  (1.549 m)   Wt 130 lb 8 oz (59.2 kg)   LMP 10/08/2018 (Exact Date)   Breastfeeding Yes   BMI 24.66 kg/m    CONSTITUTIONAL: Well-developed, well-nourished female in no acute distress.   PSYCHIATRIC: Normal mood and affect. Normal behavior. Normal judgment and thought content.  [place]: Alert and oriented to person, place, and time. Normal muscle tone coordination. No cranial nerve deficit  noted.  HENT:  Normocephalic, atraumatic, External right and left ear normal.   EYES: Conjunctivae and EOM are normal. Pupils are equal and round.   NECK: Normal range of motion, supple, no masses.  Normal thyroid.   SKIN: Skin is warm and dry. No rash noted. Not diaphoretic. No erythema. No pallor.  CARDIOVASCULAR: Normal heart rate noted, regular rhythm, no murmur.  RESPIRATORY: Clear to auscultation bilaterally. Effort and breath sounds normal, no problems with respiration noted.  BREASTS: Symmetric in size. No masses, skin changes, nipple drainage, or lymphadenopathy.  ABDOMEN: Soft, normal bowel sounds, no distention noted.  No tenderness, rebound or guarding.   PELVIC:  External Genitalia: Normal  Vagina: Normal  Cervix: Normal, Pap collected  Uterus: Normal  Adnexa: Normal   MUSCULOSKELETAL: Normal range of motion. No tenderness.  No cyanosis, clubbing, or edema.  2+ distal pulses.  LYMPHATIC: No Axillary, Supraclavicular, or Inguinal Adenopathy.  Assessment:   Annual gynecologic examination 23 y.o.   Contraception: condoms   Normal BMI   Problem List Items Addressed This Visit    None    Visit Diagnoses    Well woman exam    -  Primary   Relevant Orders   Cytology - PAP    Screening for cervical cancer       Relevant Orders   Cytology - PAP      Plan:   Pap: Pap, Reflex if ASCUS  Labs: See orders   Routine preventative health maintenance measures emphasized: Preparing Pregnancy, Ovulation pain, Exercise/Diet/Weight control, Tobacco Warnings, Alcohol/Substance use risks, Stress Management and Peer Pressure Issues; see AVS  Referral to Pelvic Floor PT, see orders  Reviewed red flag symptoms and when to call  RTC x 1 year for ANNUAL EXAM or sooner if needed   Diona Fanti, CNM [facility], [facility] 10/28/18 9:22 AM

## 2018-10-28 NOTE — Patient Instructions (Addendum)
Preventive Care 21-24 Years Old, Female Preventive care refers to visits with your health care provider and lifestyle choices that can promote health and wellness. This includes:  A yearly physical exam. This may also be called an annual well check.  Regular dental visits and eye exams.  Immunizations.  Screening for certain conditions.  Healthy lifestyle choices, such as eating a healthy diet, getting regular exercise, not using drugs or products that contain nicotine and tobacco, and limiting alcohol use. What can I expect for my preventive care visit? Physical exam Your health care provider will check your:  Height and weight. This may be used to calculate body mass index (BMI), which tells if you are at a healthy weight.  Heart rate and blood pressure.  Skin for abnormal spots. Counseling Your health care provider may ask you questions about your:  Alcohol, tobacco, and drug use.  Emotional well-being.  Home and relationship well-being.  Sexual activity.  Eating habits.  Work and work environment.  Method of birth control.  Menstrual cycle.  Pregnancy history. What immunizations do I need?  Influenza (flu) vaccine  This is recommended every year. Tetanus, diphtheria, and pertussis (Tdap) vaccine  You may need a Td booster every 10 years. Varicella (chickenpox) vaccine  You may need this if you have not been vaccinated. Human papillomavirus (HPV) vaccine  If recommended by your health care provider, you may need three doses over 6 months. Measles, mumps, and rubella (MMR) vaccine  You may need at least one dose of MMR. You may also need a second dose. Meningococcal conjugate (MenACWY) vaccine  One dose is recommended if you are age 19-21 years and a first-year college student living in a residence hall, or if you have one of several medical conditions. You may also need additional booster doses. Pneumococcal conjugate (PCV13) vaccine  You may need  this if you have certain conditions and were not previously vaccinated. Pneumococcal polysaccharide (PPSV23) vaccine  You may need one or two doses if you smoke cigarettes or if you have certain conditions. Hepatitis A vaccine  You may need this if you have certain conditions or if you travel or work in places where you may be exposed to hepatitis A. Hepatitis B vaccine  You may need this if you have certain conditions or if you travel or work in places where you may be exposed to hepatitis B. Haemophilus influenzae type b (Hib) vaccine  You may need this if you have certain conditions. You may receive vaccines as individual doses or as more than one vaccine together in one shot (combination vaccines). Talk with your health care provider about the risks and benefits of combination vaccines. What tests do I need?  Blood tests  Lipid and cholesterol levels. These may be checked every 5 years starting at age 20.  Hepatitis C test.  Hepatitis B test. Screening  Diabetes screening. This is done by checking your blood sugar (glucose) after you have not eaten for a while (fasting).  Sexually transmitted disease (STD) testing.  BRCA-related cancer screening. This may be done if you have a family history of breast, ovarian, tubal, or peritoneal cancers.  Pelvic exam and Pap test. This may be done every 3 years starting at age 21. Starting at age 30, this may be done every 5 years if you have a Pap test in combination with an HPV test. Talk with your health care provider about your test results, treatment options, and if necessary, the need for more tests.   Follow these instructions at home: Eating and drinking   Eat a diet that includes fresh fruits and vegetables, whole grains, lean protein, and low-fat dairy.  Take vitamin and mineral supplements as recommended by your health care provider.  Do not drink alcohol if: ? Your health care provider tells you not to drink. ? You are  pregnant, may be pregnant, or are planning to become pregnant.  If you drink alcohol: ? Limit how much you have to 0-1 drink a day. ? Be aware of how much alcohol is in your drink. In the [place] one drink equals one 12 oz bottle of beer (355 mL), one 5 oz glass of wine (148 mL), or one 1 oz glass of hard liquor (44 mL). Lifestyle  Take daily care of your teeth and gums.  Stay active. Exercise for at least 30 minutes on 5 or more days each week.  Do not use any products that contain nicotine or tobacco, such as cigarettes, e-cigarettes, and chewing tobacco. If you need help quitting, ask your health care provider.  If you are sexually active, practice safe sex. Use a condom or other form of birth control (contraception) in order to prevent pregnancy and STIs (sexually transmitted infections). If you plan to become pregnant, see your health care provider for a preconception visit. What's next?  Visit your health care provider once a year for a well check visit.  Ask your health care provider how often you should have your eyes and teeth checked.  Stay up to date on all vaccines. This information is not intended to replace advice given to you by your health care provider. Make sure you discuss any questions you have with your health care provider. Document Released: 03/25/2001 Document Revised: 10/08/2017 Document Reviewed: 10/08/2017 Elsevier Patient Education  2020 [employer].  Preparing for Pregnancy If you are considering becoming pregnant, make an appointment to see your regular health care provider to learn how to prepare for a safe and healthy pregnancy (preconception care). During a preconception care visit, your health care provider will:  Do a complete physical exam, including a Pap test.  Take a complete medical history.  Give you information, answer your questions, and help you resolve problems. Preconception checklist Medical history  Tell your health care provider  about any current or past medical conditions. Your pregnancy or your ability to become pregnant may be affected by chronic conditions, such as diabetes, chronic hypertension, and thyroid problems.  Include your family's medical history as well as your partner's medical history.  Tell your health care provider about any history of STIs (sexually transmitted infections).These can affect your pregnancy. In some cases, they can be passed to your baby. Discuss any concerns that you have about STIs.  If indicated, discuss the benefits of genetic testing. This testing will show whether there are any genetic conditions that may be passed from you or your partner to your baby.  Tell your health care provider about: ? Any problems you have had with conception or pregnancy. ? Any medicines you take. These include vitamins, herbal supplements, and over-the-counter medicines. ? Your history of immunizations. Discuss any vaccinations that you may need. Diet  Ask your health care provider what to include in a healthy diet that has a balance of nutrients. This is especially important when you are pregnant or preparing to become pregnant.  Ask your health care provider to help you reach a healthy weight before pregnancy. ? If you are overweight, you may be  at higher risk for certain complications, such as high blood pressure, diabetes, and preterm birth. ? If you are underweight, you are more likely to have a baby who has a low birth weight. Lifestyle, work, and home  Let your health care provider know: ? About any lifestyle habits that you have, such as alcohol use, drug use, or smoking. ? About recreational activities that may put you at risk during pregnancy, such as downhill skiing and certain exercise programs. ? Tell your health care provider about any international travel, especially any travel to places with an active [place] virus outbreak. ? About harmful substances that you may be exposed to at work  or at home. These include chemicals, pesticides, radiation, or even litter boxes. ? If you do not feel safe at home. Mental health  Tell your health care provider about: ? Any history of mental health conditions, including feelings of depression, sadness, or anxiety. ? Any medicines that you take for a mental health condition. These include herbs and supplements. Home instructions to prepare for pregnancy Lifestyle   Eat a balanced diet. This includes fresh fruits and vegetables, whole grains, lean meats, low-fat dairy products, healthy fats, and foods that are high in fiber. Ask to meet with a nutritionist or registered dietitian for assistance with meal planning and goals.  Get regular exercise. Try to be active for at least 30 minutes a day on most days of the week. Ask your health care provider which activities are safe during pregnancy.  Do not use any products that contain nicotine or tobacco, such as cigarettes and e-cigarettes. If you need help quitting, ask your health care provider.  Do not drink alcohol.  Do not take illegal drugs.  Maintain a healthy weight. Ask your health care provider what weight range is right for you. General instructions  Keep an accurate record of your menstrual periods. This makes it easier for your health care provider to determine your baby's due date.  Begin taking prenatal vitamins and folic acid supplements daily as directed by your health care provider.  Manage any chronic conditions, such as high blood pressure and diabetes, as told by your health care provider. This is important. How do I know that I am pregnant? You may be pregnant if you have been sexually active and you miss your period. Symptoms of early pregnancy include:  Mild cramping.  Very light vaginal bleeding (spotting).  Feeling unusually tired.  Nausea and vomiting (morning sickness). If you have any of these symptoms and you suspect that you might be pregnant, you can  take a home pregnancy test. These tests check for a hormone in your urine (human chorionic gonadotropin, or hCG). A woman's body begins to make this hormone during early pregnancy. These tests are very accurate. Wait until at least the first day after you miss your period to take one. If the test shows that you are pregnant (you get a positive result), call your health care provider to make an appointment for prenatal care. What should I do if I become pregnant?      Make an appointment with your health care provider as soon as you suspect you are pregnant.  Do not use any products that contain nicotine, such as cigarettes, chewing tobacco, and e-cigarettes. If you need help quitting, ask your health care provider.  Do not drink alcoholic beverages. Alcohol is related to a number of birth defects.  Avoid toxic odors and chemicals.  You may continue to have sexual  intercourse if it does not cause pain or other problems, such as vaginal bleeding. This information is not intended to replace advice given to you by your health care provider. Make sure you discuss any questions you have with your health care provider. Document Released: 01/10/2008 Document Revised: 01/29/2017 Document Reviewed: 08/19/2015 Elsevier Patient Education  2020 [place] is pain in the belly (abdomen) that affects women. The pain happens between periods. The pain:  Affects one side of the belly. The side may change from month to month.  May be mild or very bad.  May last from minutes to hours. It does not last longer than 1-2 days.  Can happen with a feeling of being sick to your stomach (nausea).  Can happen with a small amount of bleeding from your vagina. Mittelschmerz is caused by the growth and release of an egg from an ovary. It is a natural part of the ovulation cycle. It often happens about 2 weeks after a woman's period ends. Follow these instructions at home: Watch  for any changes in your condition. Let your doctor know about them. Take these actions to help with your pain:  Try soaking in a hot bath.  Try a heating pad to relax the muscles in the belly.  Take over-the-counter and prescription medicines only as told by your doctor.  Keep all follow-up visits as told by your doctor. This is important. Contact a doctor if:  The pain is very bad most months.  The pain lasts longer than 24 hours.  Your pain medicine is not helping.  You have a fever.  You feel sick to your stomach, and the feeling does not go away.  You keep throwing up (vomiting).  You miss your period.  You have more than a small amount of blood coming from your vagina between periods. Summary  Mittelschmerz is a condition that affects women. It is pain in the belly that happens between periods.  This condition is caused by the growth and release of an egg from an ovary.  The pain may affect one side of the belly, may be mild or very bad, or may cause you to feel sick to your stomach.  To help with your pain, soak in a hot bath, use a heating pad, or take medicines.  Watch for any changes in your condition. Know when to contact a doctor for help. This information is not intended to replace advice given to you by your health care provider. Make sure you discuss any questions you have with your health care provider. Document Released: 03/06/2004 Document Revised: 08/11/2017 Document Reviewed: 08/11/2017 Elsevier Patient Education  2020 [employer].

## 2018-10-29 LAB — CBC
Hematocrit: 39 % (ref 34.0–46.6)
Hemoglobin: 13.5 g/dL (ref 11.1–15.9)
MCH: 31.8 pg (ref 26.6–33.0)
MCHC: 34.6 g/dL (ref 31.5–35.7)
MCV: 92 fL (ref 79–97)
Platelets: 259 10*3/uL (ref 150–450)
RBC: 4.24 x10E6/uL (ref 3.77–5.28)
RDW: 11.7 % (ref 11.7–15.4)
WBC: 5.1 10*3/uL (ref 3.4–10.8)

## 2018-10-29 LAB — LIPID PANEL
Chol/HDL Ratio: 3.4 ratio (ref 0.0–4.4)
Cholesterol, Total: 150 mg/dL (ref 100–199)
HDL: 44 mg/dL (ref >39)
LDL Chol Calc (NIH): 86 mg/dL (ref 0–99)
Triglycerides: 107 mg/dL (ref 0–149)
VLDL Cholesterol Cal: 20 mg/dL (ref 5–40)

## 2018-10-29 LAB — VITAMIN B12: Vitamin B-12: 551 pg/mL (ref 232–1245)

## 2019-02-22 ENCOUNTER — Telehealth: Payer: Self-pay | Admitting: Certified Nurse Midwife

## 2019-02-22 NOTE — Telephone Encounter (Signed)
[facility] pathology for report.  Report received.  Called patient, 10/28/2018 negative pap smear result given and patient verbalized understanding.

## 2019-02-22 NOTE — Telephone Encounter (Signed)
Pt called in about results for her pap. Pt is requesting a call back. Please advise

## 2019-03-14 ENCOUNTER — Other Ambulatory Visit (HOSPITAL_COMMUNITY): Payer: Self-pay | Admitting: Physician Assistant

## 2019-03-14 ENCOUNTER — Other Ambulatory Visit: Payer: Self-pay | Admitting: Physician Assistant

## 2019-03-14 DIAGNOSIS — R1011 Right upper quadrant pain: Secondary | ICD-10-CM

## 2019-03-15 ENCOUNTER — Ambulatory Visit
Admission: RE | Admit: 2019-03-15 | Discharge: 2019-03-15 | Disposition: A | Discharge status: Home / Self Care | Payer: Managed Care, Other (non HMO) | Source: Ambulatory Visit | Attending: Physician Assistant | Admitting: Physician Assistant

## 2019-03-15 ENCOUNTER — Other Ambulatory Visit: Payer: Self-pay

## 2019-03-15 DIAGNOSIS — R1011 Right upper quadrant pain: Secondary | ICD-10-CM

## 2019-03-23 ENCOUNTER — Other Ambulatory Visit: Payer: Self-pay | Admitting: Surgery

## 2019-03-23 DIAGNOSIS — R1013 Epigastric pain: Secondary | ICD-10-CM

## 2019-04-06 ENCOUNTER — Ambulatory Visit: Payer: Managed Care, Other (non HMO)

## 2019-05-09 ENCOUNTER — Other Ambulatory Visit: Payer: Self-pay | Admitting: Surgery

## 2019-05-09 DIAGNOSIS — K8050 Calculus of bile duct without cholangitis or cholecystitis without obstruction: Secondary | ICD-10-CM

## 2019-05-09 DIAGNOSIS — R1013 Epigastric pain: Secondary | ICD-10-CM

## 2019-05-25 ENCOUNTER — Encounter
Admission: RE | Admit: 2019-05-25 | Discharge: 2019-05-25 | Disposition: A | Discharge status: Home / Self Care | Payer: Managed Care, Other (non HMO) | Source: Ambulatory Visit | Attending: Surgery | Admitting: Surgery

## 2019-05-25 ENCOUNTER — Other Ambulatory Visit: Payer: Self-pay

## 2019-05-25 DIAGNOSIS — R1013 Epigastric pain: Secondary | ICD-10-CM

## 2019-05-25 DIAGNOSIS — K8050 Calculus of bile duct without cholangitis or cholecystitis without obstruction: Secondary | ICD-10-CM | POA: Diagnosis present

## 2019-05-25 MED ORDER — TECHNETIUM TC 99M MEBROFENIN IV KIT
5.3940 | PACK | Freq: Once | INTRAVENOUS | Status: AC | PRN
  Administered 2019-05-25: 5.394 via INTRAVENOUS

## 2019-11-02 ENCOUNTER — Ambulatory Visit (INDEPENDENT_AMBULATORY_CARE_PROVIDER_SITE_OTHER): Payer: Managed Care, Other (non HMO) | Admitting: Obstetrics and Gynecology

## 2019-11-02 ENCOUNTER — Other Ambulatory Visit: Payer: Self-pay

## 2019-11-02 ENCOUNTER — Encounter: Payer: Self-pay | Admitting: Obstetrics and Gynecology

## 2019-11-02 VITALS — BP 122/74 | Ht 61.0 in | Wt 128.0 lb

## 2019-11-02 DIAGNOSIS — Z01419 Encounter for gynecological examination (general) (routine) without abnormal findings: Secondary | ICD-10-CM | POA: Diagnosis not present

## 2019-11-02 DIAGNOSIS — Z1339 Encounter for screening examination for other mental health and behavioral disorders: Secondary | ICD-10-CM

## 2019-11-02 DIAGNOSIS — Z1331 Encounter for screening for depression: Secondary | ICD-10-CM

## 2019-11-02 NOTE — Progress Notes (Signed)
Gynecology Annual Exam  PCP: Marguarite Arbour, MD  Chief Complaint  Patient presents with  . Annual Exam   History of Present Illness:  [name] is a 24 y.o. G1P1001 who LMP was Patient's last menstrual period was 10/31/2019., presents today for her annual examination.  Her menses are regular every 29-33 days, lasting 5 day(s).  Dysmenorrhea mild, occurring first 1-2 days of flow. She does not have intermenstrual bleeding.  She is sexually active, she does have pain with intercourse.  Last Pap: 10/28/2018 Results were: no abnormalities /neg HPV DNA not done due to age Hx of STDs: none  There is a FH of breast cancer in her maternal second cousin at age 47. There is no FH of ovarian cancer. The patient does not do self-breast exams.  She stopped breastfeeding in January.    Tobacco use: The patient denies current or previous tobacco use. Alcohol use: none Exercise: ride [place] 3-4 days per week.  The patient wears seatbelts: yes. The patient reports that domestic violence in her life is absent.   Past Medical History:  Diagnosis Date  . Endometriosis   . PCOS (polycystic ovarian syndrome)   . 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 CYSTOSCOPY N/A 11/10/2014   Procedure: CYSTOSCOPY;  Surgeon: Vena Austria, MD;  Location: [facility] ORS;  Service: Gynecology;  Laterality: N/A;  . KNEE ARTHROSCOPY Bilateral 2009, 2011  . LAPAROSCOPY N/A 11/10/2014   Procedure: LAPAROSCOPY DIAGNOSTIC with biopsies;  Surgeon: Vena Austria, MD;  Location: [facility] ORS;  Service: Gynecology;  Laterality: N/A;  . WISDOM TOOTH EXTRACTION      Prior to Admission medications   Denies   Allergies: No Known Allergies  Obstetric History: G1P1001, s/p SVD x 1  Social History   Socioeconomic History  . Marital status: Married    Spouse name: Not on file  . Number of children: Not on file  . Years of education: Not on file  .  Highest education level: Not on file  Occupational History  . Not on file  Tobacco Use  . Smoking status: Never Smoker  . Smokeless tobacco: Never Used  Vaping Use  . Vaping Use: Never used  Substance and Sexual Activity  . Alcohol use: No  . Drug use: No  . Sexual activity: Yes    Birth control/protection: Condom  Other Topics Concern  . Not on file  Social History Narrative   Pt lives in 2 story home with her husband and 2 dogs   Currently pregnant with her first child   Has a general education associates degree   Current nursing student   Social Determinants of Health   Financial Resource Strain:   . Difficulty of Paying Living Expenses: Not on file  Food Insecurity:   . Worried About Programme researcher, broadcasting/film/video in the Last Year: Not on file  . Ran Out of Food in the Last Year: Not on file  Transportation Needs:   . Lack of Transportation (Medical): Not on file  . Lack of Transportation (Non-Medical): Not on file  Physical Activity:   . Days of Exercise per Week: Not on file  . Minutes of Exercise per Session: Not on file  Stress:   . Feeling of Stress : Not on file  Social Connections:   . Frequency of Communication with Friends and Family: Not on file  . Frequency of Social Gatherings with Friends and Family:  Not on file  . Attends Religious Services: Not on file  . Active Member of Clubs or Organizations: Not on file  . Attends Banker Meetings: Not on file  . Marital Status: Not on file  Intimate Partner Violence:   . Fear of Current or Ex-Partner: Not on file  . Emotionally Abused: Not on file  . Physically Abused: Not on file  . Sexually Abused: Not on file    Family History  Problem Relation Age of Onset  . Diabetes Paternal Grandmother   . Heart disease Paternal Grandmother   . Breast cancer Neg Hx   . Ovarian cancer Neg Hx   . Colon cancer Neg Hx     Review of Systems  Constitutional: Negative.   HENT: Negative.   Eyes: Negative.    Respiratory: Negative.   Cardiovascular: Negative.   Gastrointestinal: Negative.   Genitourinary: Negative.   Musculoskeletal: Negative.   Skin: Negative.   Neurological: Negative.   Psychiatric/Behavioral: Negative.      Physical Exam BP 122/74   Ht 5\' 1"  (1.549 m)   Wt 128 lb (58.1 kg)   LMP 10/31/2019   BMI 24.19 kg/m   Physical Exam Constitutional:      General: She is not in acute distress.    Appearance: Normal appearance. She is well-developed.  Genitourinary:     Pelvic exam was performed with patient in the lithotomy position.     Vulva, urethra, bladder and uterus normal.     No inguinal adenopathy present in the right or left side.    No signs of injury in the vagina.     No vaginal discharge, erythema, tenderness or bleeding.     No cervical motion tenderness, discharge, lesion or polyp.     Uterus is mobile.     Uterus is not enlarged or tender.     No uterine mass detected.    Uterus is anteverted.     No right or left adnexal mass present.     Right adnexa not tender or full.     Left adnexa not tender or full.  HENT:     Head: Normocephalic and atraumatic.  Eyes:     General: No scleral icterus.    Conjunctiva/sclera: Conjunctivae normal.  Neck:     Thyroid: No thyromegaly.  Cardiovascular:     Rate and Rhythm: Normal rate and regular rhythm.     Heart sounds: No murmur heard.  No friction rub. No gallop.   Pulmonary:     Effort: Pulmonary effort is normal. No respiratory distress.     Breath sounds: Normal breath sounds. No wheezing or rales.  Chest:     Breasts:        Right: No inverted nipple, mass, nipple discharge, skin change or tenderness.        Left: No inverted nipple, mass, nipple discharge, skin change or tenderness.  Abdominal:     General: Bowel sounds are normal. There is no distension.     Palpations: Abdomen is soft. There is no mass.     Tenderness: There is no abdominal tenderness. There is no guarding or rebound.   Musculoskeletal:        General: No swelling or tenderness. Normal range of motion.     Cervical back: Normal range of motion and neck supple.  Lymphadenopathy:     Cervical: No cervical adenopathy.     Lower Body: No right inguinal adenopathy. No left inguinal adenopathy.  Neurological:  General: No focal deficit present.     Mental Status: She is alert and oriented to person, place, and time.     Cranial Nerves: No cranial nerve deficit.  Skin:    General: Skin is warm and dry.     Findings: No erythema or rash.  Psychiatric:        Mood and Affect: Mood normal.        Behavior: Behavior normal.        Judgment: Judgment normal.     Female chaperone present for pelvic and breast  portions of the physical exam  Results: AUDIT Questionnaire (screen for alcoholism): 0 PHQ-9: 1   Assessment: 24 y.o. G54P1001 female here for routine annual gynecologic examination  Plan: Problem List Items Addressed This Visit    None    Visit Diagnoses    Women's annual routine gynecological examination    -  Primary   Screening for depression       Screening for alcoholism          Screening: -- Blood pressure screen normal -- Weight screening: normal -- Depression screening negative (PHQ-9) -- Nutrition: normal -- cholesterol screening: not due for screening -- osteoporosis screening: not due -- tobacco screening: not using -- alcohol screening: AUDIT questionnaire indicates low-risk usage. -- family history of breast cancer screening: done. not at high risk. -- no evidence of domestic violence or intimate partner violence. -- STD screening: gonorrhea/chlamydia NAAT not collected per patient request. -- pap smear not collected per ASCCP guidelines -- flu vaccine: declines -- Exempt from COVID19 vaccine.  She will be utilizing a pelvic floor physical therapist for dyspareunia.  We also discussed her desire to conceive within the next few months.  She has a history of PCOS and  endometriosis.  However, her menses come with regularity at this point. So, she is likely ovulating.  Will continue to assist her in any way she needs for this issue.  She is considering delivery at a birthing center, depending on how COVID might be affecting hospital policies for deliveries at [facility].  I offered support for her in this regard.    Thomasene Mohair, MD 11/02/2019 2:58 PM

## 2020-02-06 ENCOUNTER — Other Ambulatory Visit: Payer: Managed Care, Other (non HMO)

## 2020-02-06 ENCOUNTER — Other Ambulatory Visit: Payer: Self-pay

## 2020-02-06 ENCOUNTER — Other Ambulatory Visit: Payer: Self-pay | Admitting: Obstetrics and Gynecology

## 2020-02-06 DIAGNOSIS — N912 Amenorrhea, unspecified: Secondary | ICD-10-CM

## 2020-02-06 DIAGNOSIS — E282 Polycystic ovarian syndrome: Secondary | ICD-10-CM

## 2020-02-07 LAB — BETA HCG QUANT (REF LAB): hCG Quant: <1 m[IU]/mL

## 2020-02-07 LAB — PROGESTERONE: Progesterone: 10.7 ng/mL

## 2020-07-06 ENCOUNTER — Ambulatory Visit (INDEPENDENT_AMBULATORY_CARE_PROVIDER_SITE_OTHER): Payer: Managed Care, Other (non HMO) | Admitting: Obstetrics and Gynecology

## 2020-07-06 ENCOUNTER — Encounter: Payer: Self-pay | Admitting: Obstetrics and Gynecology

## 2020-07-06 ENCOUNTER — Other Ambulatory Visit: Payer: Self-pay

## 2020-07-06 VITALS — BP 110/60 | Ht 61.0 in | Wt 121.0 lb

## 2020-07-06 DIAGNOSIS — N898 Other specified noninflammatory disorders of vagina: Secondary | ICD-10-CM | POA: Diagnosis not present

## 2020-07-06 NOTE — Progress Notes (Signed)
Obstetrics & Gynecology Office Visit   Chief Complaint  Patient presents with  . Vaginal Discharge    Fishy odor after intercourse x couple of weeks    History of Present Illness: [name] is a 25 y.o. G1P1001 who LMP was Patient's last menstrual period was 06/10/2020 (exact date)., presents today for a problem visit.   For the past 2 weeks she has had an abnormal odor after intercourse.  She notes that in the past when she had this abnormal odor she was diagnosed with bacterial vaginosis.  She states she only has the odor after intercourse and the odor is present for about 12 hours.  At no other times that she have the abnormal odor.  She denies itching burning and irritation in the vaginal area, along with abnormal discharge.  Of note, she had a very faint positive pregnancy test today.  Her last period started about 3 weeks and 4 days ago.    Past Medical History:  Diagnosis Date  . Endometriosis   . PCOS (polycystic ovarian syndrome)   . 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 CYSTOSCOPY N/A 11/10/2014   Procedure: CYSTOSCOPY;  Surgeon: Vena Austria, MD;  Location: [facility] ORS;  Service: Gynecology;  Laterality: N/A;  . KNEE ARTHROSCOPY Bilateral 2009, 2011  . LAPAROSCOPY N/A 11/10/2014   Procedure: LAPAROSCOPY DIAGNOSTIC with biopsies;  Surgeon: Vena Austria, MD;  Location: [facility] ORS;  Service: Gynecology;  Laterality: N/A;  . WISDOM TOOTH EXTRACTION      Gynecologic History: Patient's last menstrual period was 06/10/2020 (exact date).  Obstetric History: G1P1001  Family History  Problem Relation Age of Onset  . Diabetes Paternal Grandmother   . Heart disease Paternal Grandmother   . Breast cancer Neg Hx   . Ovarian cancer Neg Hx   . Colon cancer Neg Hx     Social History   Socioeconomic History  . Marital status: Married    Spouse name: Not on file  . Number of children: Not on file  .  Years of education: Not on file  . Highest education level: Not on file  Occupational History  . Not on file  Tobacco Use  . Smoking status: Never Smoker  . Smokeless tobacco: Never Used  Vaping Use  . Vaping Use: Never used  Substance and Sexual Activity  . Alcohol use: No  . Drug use: No  . Sexual activity: Yes    Birth control/protection: None  Other Topics Concern  . Not on file  Social History Narrative   Pt lives in 2 story home with her husband and 2 dogs   Currently pregnant with her first child   Has a general education associates degree   Current nursing student   Social Determinants of Health   Financial Resource Strain: Not on file  Food Insecurity: Not on file  Transportation Needs: Not on file  Physical Activity: Not on file  Stress: Not on file  Social Connections: Not on file  Intimate Partner Violence: Not on file    No Known Allergies  Prior to Admission medications   Denies    Review of Systems  Constitutional: Negative.   HENT: Negative.   Eyes: Negative.   Respiratory: Negative.   Cardiovascular: Negative.   Gastrointestinal: Negative.   Genitourinary: Negative.        See HPI  Musculoskeletal: Negative.   Skin: Negative.   Neurological: Negative.  Psychiatric/Behavioral: Negative.      Physical Exam BP 110/60   Ht 5\' 1"  (1.549 m)   Wt 121 lb (54.9 kg)   LMP 06/10/2020 (Exact Date)   BMI 22.86 kg/m  Patient's last menstrual period was 06/10/2020 (exact date). Physical Exam Constitutional:      General: She is not in acute distress.    Appearance: Normal appearance.  Genitourinary:     Bladder and urethral meatus normal.     No lesions in the vagina.     Right Labia: No rash, tenderness, lesions or skin changes.    Left Labia: No tenderness, lesions, skin changes or rash.    No inguinal adenopathy present in the right or left side.    Pelvic Tanner Score: 5/5.    No vaginal discharge, erythema, bleeding or ulceration.      No vaginal prolapse present.     Right Adnexa: not tender, not full and no mass present.    Left Adnexa: not tender, not full and no mass present.    No cervical motion tenderness, discharge, friability, lesion or polyp.     Uterus is not enlarged, fixed or tender.     Uterus is anteverted.  HENT:     Head: Normocephalic and atraumatic.  Eyes:     General: No scleral icterus.    Conjunctiva/sclera: Conjunctivae normal.  Lymphadenopathy:     Lower Body: No right inguinal adenopathy. No left inguinal adenopathy.  Neurological:     General: No focal deficit present.     Mental Status: She is alert and oriented to person, place, and time.     Cranial Nerves: No cranial nerve deficit.  Psychiatric:        Mood and Affect: Mood normal.        Behavior: Behavior normal.        Judgment: Judgment normal.    Female chaperone present for pelvic and breast  portions of the physical exam  Wet Prep: PH: 4.0 Clue Cells: Negative Fungal elements: Negative Trichomonas: Negative   Assessment: 25 y.o. G53P1001 female here for  1. Vaginal odor      Plan: Problem List Items Addressed This Visit   None   Visit Diagnoses    Vaginal odor    -  Primary     Her source of vaginal odor is unknown.  She meets 0 criteria for bacterial vaginosis.  Given her new diagnosis of pregnancy, we will definitely not treat at this time.  If her symptoms worsen or new symptoms develop, may consider treatment at that time.  Encouraged her to enter into prenatal care for her recently diagnosed pregnancy.  All questions answered.  A total of 22 minutes were spent face-to-face with the patient as well as preparation, review, communication, and documentation during this encounter.    G3P100, MD 07/06/2020 6:21 PM

## 2020-08-13 ENCOUNTER — Other Ambulatory Visit: Payer: Self-pay | Admitting: Obstetrics and Gynecology

## 2020-08-13 DIAGNOSIS — O219 Vomiting of pregnancy, unspecified: Secondary | ICD-10-CM

## 2020-08-13 MED ORDER — PROMETHAZINE HCL 25 MG PO TABS: 25.0000 mg | ORAL_TABLET | Freq: Four times a day (QID) | ORAL | 1 refills | Status: DC | PRN

## 2020-08-28 ENCOUNTER — Other Ambulatory Visit: Payer: Self-pay | Admitting: Obstetrics and Gynecology

## 2020-08-28 DIAGNOSIS — Z3481 Encounter for supervision of other normal pregnancy, first trimester: Secondary | ICD-10-CM

## 2020-09-10 ENCOUNTER — Other Ambulatory Visit: Payer: Self-pay

## 2020-09-10 ENCOUNTER — Ambulatory Visit: Payer: Managed Care, Other (non HMO) | Admitting: Obstetrics and Gynecology

## 2020-09-10 ENCOUNTER — Encounter: Payer: Self-pay | Admitting: Obstetrics and Gynecology

## 2020-09-10 VITALS — BP 121/70 | Ht 61.0 in | Wt 120.8 lb

## 2020-09-10 DIAGNOSIS — Z3A13 13 weeks gestation of pregnancy: Secondary | ICD-10-CM

## 2020-09-10 DIAGNOSIS — O4690 Antepartum hemorrhage, unspecified, unspecified trimester: Secondary | ICD-10-CM

## 2020-09-10 DIAGNOSIS — O4691 Antepartum hemorrhage, unspecified, first trimester: Secondary | ICD-10-CM

## 2020-09-10 DIAGNOSIS — Z3A10 10 weeks gestation of pregnancy: Secondary | ICD-10-CM

## 2020-09-10 LAB — POCT URINALYSIS DIPSTICK OB
Glucose, UA: NEGATIVE
POC,PROTEIN,UA: NEGATIVE

## 2020-09-10 NOTE — Telephone Encounter (Signed)
Patient is scheduled 09/10/20 at 1:15 with SDJ

## 2020-09-11 ENCOUNTER — Encounter: Payer: Self-pay | Admitting: Obstetrics and Gynecology

## 2020-09-11 NOTE — Progress Notes (Signed)
Obstetrics & Gynecology Office Visit   Chief Complaint  Patient presents with   Routine Prenatal Visit   Gynecologic Exam    History of Present Illness: 25 y.o. G22P1001 female at [redacted]w[redacted]d by a first trimester ultrasound who presents with vaginal bleeding of recent onset.  When she went to the bath room today she had brown bleeding that has continued such that she has to wear a panty liner.  She has had cramping, which has been present for a good portion of her pregnancy to this point.  She has not yet felt fetal movement.  She has had nausea and just hasn't felt like herself.    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  Obstetric History: G2P1001  Family History  Problem Relation Age of Onset   Diabetes Paternal Grandmother    Heart disease Paternal Grandmother    Breast cancer Neg Hx    Ovarian cancer Neg Hx    Colon cancer Neg Hx     Social History   Socioeconomic History   Marital status: Married    Spouse name: Not on file   Number of children: Not on file   Years of education: Not on file   Highest education level: Not on file  Occupational History   Not on file  Tobacco Use   Smoking status: Never   Smokeless tobacco: Never  Vaping Use   Vaping Use: Never used  Substance and Sexual Activity   Alcohol use: No   Drug use: No   Sexual activity:  Yes    Birth control/protection: None  Other Topics Concern   Not on file  Social History Narrative   Pt lives in 2 story home with her husband and 2 dogs   Currently pregnant with her first child   Has a 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 Authorizing Provider  promethazine (PHENERGAN) 25 MG tablet Take 1 tablet (25 mg total) by mouth every 6 (six) hours as needed for nausea or vomiting. 08/13/20  Yes Conard Novak, MD    Review of Systems  Constitutional: Negative.   HENT: Negative.    Eyes: Negative.   Respiratory: Negative.    Cardiovascular: Negative.   Gastrointestinal:  Positive for abdominal pain (cramping, as noted above) and nausea. Negative for blood in stool, constipation, diarrhea, heartburn, melena and vomiting.       See HPI  Genitourinary: Negative.   Musculoskeletal: Negative.   Skin:  Negative.   Neurological: Negative.   Psychiatric/Behavioral: Negative.      Physical Exam BP 121/70   Ht 5\' 1"  (1.549 m)   Wt 120 lb 12.8 oz (54.8 kg)   LMP 06/10/2020 (Exact Date)   BMI 22.82 kg/m  Patient's last menstrual period was 06/10/2020 (exact date). Physical Exam Constitutional:      General: She is not in acute distress.    Appearance: Normal appearance.  HENT:     Head: Normocephalic and atraumatic.  Eyes:     General: No scleral icterus.    Conjunctiva/sclera: Conjunctivae normal.  Neurological:     General: No focal deficit present.     Mental Status: She is alert and oriented to person, place, and time.     Cranial Nerves: No cranial nerve deficit.   Psychiatric:        Mood and Affect: Mood normal.        Behavior: Behavior normal.        Judgment: Judgment normal.   Bedside transabdominal ultrasound performed: Single, living intrauterine pregnancy with CRL consistent with reported gestational age. +fetal cardiac activity.   There is no obvious source of bleeding. The placenta is located on the right and appears enlarged. It is unclear whether there is a thickened wall of the uterus or a retroplacental hematoma.  There is no obvious previa. There is no obvious bleed within an apparently closed cervix.    Assessment: 25 y.o. G97P1001 female here for  1. Vaginal bleeding in pregnancy   2. [redacted] weeks gestation of pregnancy      Plan: Problem List Items Addressed This Visit   None Visit Diagnoses     Vaginal bleeding in pregnancy    -  Primary   [redacted] weeks gestation of pregnancy       Relevant Orders   POC Urinalysis Dipstick OB (Completed)      Discussed bleeding in pregnancy. Discussed ultrasound findings. Patient reassured today. Will continue to follow until resolution.  We discussed that there is no treatment or intervention for bleeding in pregnancy.   A total of 24 minutes were spent face-to-face with the patient as well as preparation, review, communication, and documentation during this encounter.    G3P141, MD 09/10/2020 1:22 PM

## 2020-10-29 ENCOUNTER — Other Ambulatory Visit: Payer: Self-pay

## 2020-10-29 ENCOUNTER — Ambulatory Visit
Admission: RE | Admit: 2020-10-29 | Discharge: 2020-10-29 | Disposition: A | Discharge status: Home / Self Care | Payer: BC Managed Care – PPO | Source: Ambulatory Visit | Attending: Obstetrics and Gynecology | Admitting: Obstetrics and Gynecology

## 2020-10-29 DIAGNOSIS — Z3481 Encounter for supervision of other normal pregnancy, first trimester: Secondary | ICD-10-CM | POA: Diagnosis not present

## 2020-10-30 ENCOUNTER — Ambulatory Visit: Payer: Managed Care, Other (non HMO) | Admitting: Obstetrics and Gynecology

## 2020-10-30 ENCOUNTER — Other Ambulatory Visit: Payer: Self-pay | Admitting: Obstetrics and Gynecology

## 2020-10-30 DIAGNOSIS — Z113 Encounter for screening for infections with a predominantly sexual mode of transmission: Secondary | ICD-10-CM

## 2020-10-30 DIAGNOSIS — Z3A20 20 weeks gestation of pregnancy: Secondary | ICD-10-CM

## 2020-10-30 DIAGNOSIS — Z3482 Encounter for supervision of other normal pregnancy, second trimester: Secondary | ICD-10-CM

## 2020-10-31 ENCOUNTER — Other Ambulatory Visit (HOSPITAL_COMMUNITY)
Admission: RE | Admit: 2020-10-31 | Discharge: 2020-10-31 | Disposition: A | Discharge status: Home / Self Care | Payer: BC Managed Care – PPO | Source: Ambulatory Visit | Attending: Obstetrics and Gynecology | Admitting: Obstetrics and Gynecology

## 2020-10-31 ENCOUNTER — Other Ambulatory Visit: Payer: Self-pay

## 2020-10-31 ENCOUNTER — Ambulatory Visit (INDEPENDENT_AMBULATORY_CARE_PROVIDER_SITE_OTHER): Payer: Managed Care, Other (non HMO) | Admitting: Obstetrics and Gynecology

## 2020-10-31 ENCOUNTER — Other Ambulatory Visit: Payer: Managed Care, Other (non HMO)

## 2020-10-31 DIAGNOSIS — Z3401 Encounter for supervision of normal first pregnancy, first trimester: Secondary | ICD-10-CM

## 2020-10-31 NOTE — Progress Notes (Signed)
Here for prenatal blood work. Nurse visit only.   Collected STD screening by self-collect I had no interaction with the patient. She was only here for the collection of the sample from above and for blood work. Orders entered.

## 2020-11-02 LAB — HEMOGLOBIN A1C
Est. average glucose Bld gHb Est-mCnc: 82 mg/dL
Hgb A1c MFr Bld: 4.5 % — ABNORMAL LOW (ref 4.8–5.6)

## 2020-11-02 LAB — CERVICOVAGINAL ANCILLARY ONLY
Chlamydia: NEGATIVE
Comment: NEGATIVE
Comment: NORMAL
Neisseria Gonorrhea: NEGATIVE

## 2020-11-02 LAB — RPR+RH+ABO+RUB AB+AB SCR
Antibody Screen: NEGATIVE
RPR Ser Ql: NONREACTIVE
Rh Factor: POSITIVE
Rubella Antibodies, IGG: 0.98 index — ABNORMAL LOW (ref >0.99)

## 2020-11-02 LAB — TSH: TSH: 0.795 u[IU]/mL (ref 0.450–4.500)

## 2020-11-02 LAB — HEPATITIS C ANTIBODY: Hep C Virus Ab: <0.1 s/co ratio (ref 0.0–0.9)

## 2020-11-03 LAB — URINE CULTURE

## 2020-11-07 ENCOUNTER — Other Ambulatory Visit: Payer: Self-pay | Admitting: Obstetrics and Gynecology

## 2020-11-07 DIAGNOSIS — Z3482 Encounter for supervision of other normal pregnancy, second trimester: Secondary | ICD-10-CM

## 2020-11-09 ENCOUNTER — Other Ambulatory Visit: Payer: Self-pay

## 2020-11-09 ENCOUNTER — Other Ambulatory Visit: Payer: BC Managed Care – PPO

## 2020-11-09 DIAGNOSIS — Z3482 Encounter for supervision of other normal pregnancy, second trimester: Secondary | ICD-10-CM

## 2020-11-10 LAB — CBC WITH DIFFERENTIAL/PLATELET
Basophils Absolute: 0 10*3/uL (ref 0.0–0.2)
Basos: 0 %
EOS (ABSOLUTE): 0.1 10*3/uL (ref 0.0–0.4)
Eos: 2 %
Hematocrit: 34 % (ref 34.0–46.6)
Hemoglobin: 11.6 g/dL (ref 11.1–15.9)
Immature Grans (Abs): 0 10*3/uL (ref 0.0–0.1)
Immature Granulocytes: 1 %
Lymphocytes Absolute: 1.5 10*3/uL (ref 0.7–3.1)
Lymphs: 22 %
MCH: 33.7 pg — ABNORMAL HIGH (ref 26.6–33.0)
MCHC: 34.1 g/dL (ref 31.5–35.7)
MCV: 99 fL — ABNORMAL HIGH (ref 79–97)
Monocytes Absolute: 0.2 10*3/uL (ref 0.1–0.9)
Monocytes: 4 %
Neutrophils Absolute: 4.9 10*3/uL (ref 1.4–7.0)
Neutrophils: 71 %
Platelets: 242 10*3/uL (ref 150–450)
RBC: 3.44 x10E6/uL — ABNORMAL LOW (ref 3.77–5.28)
RDW: 12.4 % (ref 11.7–15.4)
WBC: 6.8 10*3/uL (ref 3.4–10.8)

## 2020-11-10 LAB — HEPATITIS B SURFACE ANTIGEN: Hepatitis B Surface Ag: NEGATIVE

## 2020-12-09 ENCOUNTER — Observation Stay
Admission: RE | Admit: 2020-12-09 | Discharge: 2020-12-09 | Disposition: A | Discharge status: Home / Self Care | Payer: BC Managed Care – PPO | Attending: Obstetrics and Gynecology | Admitting: Obstetrics and Gynecology

## 2020-12-09 ENCOUNTER — Other Ambulatory Visit: Payer: Self-pay

## 2020-12-09 ENCOUNTER — Encounter: Payer: Self-pay | Admitting: Emergency Medicine

## 2020-12-09 DIAGNOSIS — O26899 Other specified pregnancy related conditions, unspecified trimester: Secondary | ICD-10-CM | POA: Diagnosis not present

## 2020-12-09 DIAGNOSIS — R55 Syncope and collapse: Secondary | ICD-10-CM

## 2020-12-09 DIAGNOSIS — Z3A00 Weeks of gestation of pregnancy not specified: Secondary | ICD-10-CM | POA: Insufficient documentation

## 2020-12-09 HISTORY — DX: Syncope and collapse: R55

## 2020-12-09 LAB — GLUCOSE, CAPILLARY: Glucose-Capillary: 102 mg/dL — ABNORMAL HIGH (ref 70–99)

## 2020-12-09 MED ORDER — LACTATED RINGERS IV BOLUS
1000.0000 mL | Freq: Once | INTRAVENOUS | Status: AC
  Administered 2020-12-09: 1000 mL via INTRAVENOUS

## 2020-12-09 NOTE — OB Triage Note (Signed)
Pt arrives with a near syncopal episode at work. Pt intially had a BP 86/41 with resolved repeat BP's.

## 2020-12-09 NOTE — Progress Notes (Signed)
Pt discharged home per Jerene Pitch, MD order.  Pt stable and ambulatory and an After Visit Summary was printed and given to the patient. Pt feeling overall better after syncopal episode. Pt ambulated to the bathroom and tolerated activity well. Repeat BP 117/63. Discharge education completed with patient/family including follow up instructions, appointments, and medication list. Pt received labor and bleeding precautions. Patient able to verbalize understanding, all questions fully answered upon discharge. Patient instructed to return to ED, call 911, or call MD for any changes in condition. Pt discharged home via personal vehicle with support person.

## 2020-12-18 NOTE — Discharge Summary (Signed)
Physician Discharge Summary   Patient ID: CHAIA IKARD [id number] 25 y.o. 08-04-1995  Admit date: 12/09/2020  Discharge date and time: 12/09/2020  8:00 AM   Admitting Physician: Natale Milch, MD   Discharge Physician: Adelene Idler MD  Admission Diagnoses: Syncope [R55]  Discharge Diagnoses: Syncooe  Admission Condition: poor  Discharged Condition: good  Indication for Admission: Patient was working on L&D when she experienced a syncopal episode.   Hospital Course: She was admitted and IV access was obtained emergently. She was given IV fluids. Fetal monitoring was normal. She reported that she had not slept prior to her shift. She was also emotionally overwhelmed from challenging patient care situations on the floor. When she felt better she requested discharge home for rest. She noted normal fetal movement. No vaginal bleeding, uterine contractions initially on the monitor which improved with hydration. Reactive NST.  Consults: None  Significant Diagnostic Studies: labs: none  Treatments: IV hydration  Discharge Exam: No exam performed today, patient desired discharge home.  Disposition: Discharge disposition: 01-Home or Self Care       Patient Instructions:  Allergies as of 12/09/2020   No Known Allergies      Medication List     ASK your doctor about these medications    promethazine 25 MG tablet Commonly known as: PHENERGAN Take 1 tablet (25 mg total) by mouth every 6 (six) hours as needed for nausea or vomiting.       Activity: activity as tolerated Diet: regular diet Wound Care: none needed  Encouraged follow-up with [facility] for routine ob care.  Patient reported she is seeking a home delivery with a midwife who is providing her prenatal care  Signed: Antha Niday R Jamorion Gomillion 12/18/2020 8:10 PM

## 2021-01-05 ENCOUNTER — Other Ambulatory Visit: Payer: Self-pay | Admitting: Obstetrics and Gynecology

## 2021-01-05 DIAGNOSIS — Z131 Encounter for screening for diabetes mellitus: Secondary | ICD-10-CM

## 2021-01-05 DIAGNOSIS — Z113 Encounter for screening for infections with a predominantly sexual mode of transmission: Secondary | ICD-10-CM

## 2021-01-05 DIAGNOSIS — Z3483 Encounter for supervision of other normal pregnancy, third trimester: Secondary | ICD-10-CM

## 2021-01-10 ENCOUNTER — Other Ambulatory Visit: Payer: Managed Care, Other (non HMO)

## 2021-01-14 ENCOUNTER — Other Ambulatory Visit: Payer: Self-pay

## 2021-01-14 ENCOUNTER — Other Ambulatory Visit: Payer: BC Managed Care – PPO

## 2021-01-14 DIAGNOSIS — Z113 Encounter for screening for infections with a predominantly sexual mode of transmission: Secondary | ICD-10-CM

## 2021-01-14 DIAGNOSIS — Z131 Encounter for screening for diabetes mellitus: Secondary | ICD-10-CM

## 2021-01-14 DIAGNOSIS — Z3483 Encounter for supervision of other normal pregnancy, third trimester: Secondary | ICD-10-CM

## 2021-01-15 LAB — 28 WEEK RH+PANEL
Basophils Absolute: 0 10*3/uL (ref 0.0–0.2)
Basos: 0 %
EOS (ABSOLUTE): 0.1 10*3/uL (ref 0.0–0.4)
Eos: 1 %
Gestational Diabetes Screen: 112 mg/dL (ref 70–139)
HIV Screen 4th Generation wRfx: NONREACTIVE
Hematocrit: 32.1 % — ABNORMAL LOW (ref 34.0–46.6)
Hemoglobin: 11.2 g/dL (ref 11.1–15.9)
Immature Grans (Abs): 0.1 10*3/uL (ref 0.0–0.1)
Immature Granulocytes: 1 %
Lymphocytes Absolute: 1.9 10*3/uL (ref 0.7–3.1)
Lymphs: 25 %
MCH: 33.4 pg — ABNORMAL HIGH (ref 26.6–33.0)
MCHC: 34.9 g/dL (ref 31.5–35.7)
MCV: 96 fL (ref 79–97)
Monocytes Absolute: 0.5 10*3/uL (ref 0.1–0.9)
Monocytes: 6 %
Neutrophils Absolute: 5.1 10*3/uL (ref 1.4–7.0)
Neutrophils: 67 %
Platelets: 280 10*3/uL (ref 150–450)
RBC: 3.35 x10E6/uL — ABNORMAL LOW (ref 3.77–5.28)
RDW: 11.8 % (ref 11.7–15.4)
RPR Ser Ql: NONREACTIVE
WBC: 7.7 10*3/uL (ref 3.4–10.8)

## 2021-01-15 LAB — VARICELLA ZOSTER ANTIBODY, IGG: Varicella zoster IgG: 617 index (ref >165)

## 2021-02-10 NOTE — L&D Delivery Note (Signed)
Delivery Note At 4:42 AM a viable female infant was delivered via Vaginal, Spontaneous (Presentation: Left Occiput Anterior).  APGAR: 8, 9; weight pending. Placenta status: Spontaneous, Intact.  Cord: 3 vessels with the following complications: None.  Cord pH: n/a  Anesthesia: None Episiotomy: None Lacerations: None Suture Repair:  n/a Est. Blood Loss (mL): 350  Mom to postpartum.  Baby to Couplet care / Skin to Skin.  Called to see patient.  Mom pushed to deliver a viable female infant.  The head followed by shoulders, which delivered without difficulty, and the rest of the body.  Delivered through a single nuchal cord which was reduced after delivery of baby.  Baby to mom's chest.  Cord clamped and cut after > 1 min delay.  Cord blood obtained.  Placenta delivered spontaneously, intact, with a 3-vessel cord.  No vaginal, cervical, or perineal lacerations. All counts correct.  Hemostasis obtained with fundal massage. Patient elects to hold on pitocin unless bleeding is increased. EBL 350 mL.     Thomasene Mohair, MD 03/02/2021, 5:11 AM

## 2021-03-02 ENCOUNTER — Other Ambulatory Visit: Payer: Self-pay

## 2021-03-02 ENCOUNTER — Encounter: Payer: Self-pay | Admitting: Obstetrics and Gynecology

## 2021-03-02 ENCOUNTER — Inpatient Hospital Stay
Admission: EM | Admit: 2021-03-02 | Discharge: 2021-03-02 | DRG: 807 | Disposition: A | Discharge status: Home / Self Care | Payer: BC Managed Care – PPO | Attending: Obstetrics and Gynecology | Admitting: Obstetrics and Gynecology

## 2021-03-02 DIAGNOSIS — Z3A37 37 weeks gestation of pregnancy: Secondary | ICD-10-CM | POA: Diagnosis not present

## 2021-03-02 DIAGNOSIS — O26893 Other specified pregnancy related conditions, third trimester: Secondary | ICD-10-CM | POA: Diagnosis present

## 2021-03-02 LAB — CBC
HCT: 36 % (ref 36.0–46.0)
Hemoglobin: 12.7 g/dL (ref 12.0–15.0)
MCH: 33.3 pg (ref 26.0–34.0)
MCHC: 35.3 g/dL (ref 30.0–36.0)
MCV: 94.5 fL (ref 80.0–100.0)
Platelets: 285 10*3/uL (ref 150–400)
RBC: 3.81 MIL/uL — ABNORMAL LOW (ref 3.87–5.11)
RDW: 12.9 % (ref 11.5–15.5)
WBC: 18.9 10*3/uL — ABNORMAL HIGH (ref 4.0–10.5)
nRBC: 0 % (ref 0.0–0.2)

## 2021-03-02 LAB — TYPE AND SCREEN
ABO/RH(D): O POS
Antibody Screen: NEGATIVE

## 2021-03-02 MED ORDER — LIDOCAINE HCL (PF) 1 % IJ SOLN: 30.0000 mL | INTRAMUSCULAR | Status: DC | PRN

## 2021-03-02 MED ORDER — OXYTOCIN BOLUS FROM INFUSION: 333.0000 mL | Freq: Once | INTRAVENOUS | Status: DC

## 2021-03-02 MED ORDER — ONDANSETRON HCL 4 MG/2ML IJ SOLN
4.0000 mg | Freq: Four times a day (QID) | INTRAMUSCULAR | Status: DC | PRN
Start: 2021-03-02 — End: 2021-03-02

## 2021-03-02 MED ORDER — ONDANSETRON HCL 4 MG/2ML IJ SOLN: 4.0000 mg | INTRAMUSCULAR | Status: DC | PRN

## 2021-03-02 MED ORDER — WITCH HAZEL-GLYCERIN EX PADS: 1.0000 "application " | MEDICATED_PAD | CUTANEOUS | Status: DC | PRN

## 2021-03-02 MED ORDER — DIBUCAINE (PERIANAL) 1 % EX OINT: 1.0000 "application " | TOPICAL_OINTMENT | CUTANEOUS | Status: DC | PRN

## 2021-03-02 MED ORDER — ONDANSETRON HCL 4 MG PO TABS: 4.0000 mg | ORAL_TABLET | ORAL | Status: DC | PRN

## 2021-03-02 MED ORDER — FENTANYL CITRATE (PF) 100 MCG/2ML IJ SOLN
INTRAMUSCULAR | Status: AC
  Filled 2021-03-02: qty 2

## 2021-03-02 MED ORDER — FENTANYL CITRATE (PF) 100 MCG/2ML IJ SOLN
25.0000 ug | Freq: Once | INTRAMUSCULAR | Status: AC
  Administered 2021-03-02: 25 ug via INTRAVENOUS

## 2021-03-02 MED ORDER — ACETAMINOPHEN 325 MG PO TABS: 650.0000 mg | ORAL_TABLET | ORAL | Status: DC | PRN

## 2021-03-02 MED ORDER — SENNOSIDES-DOCUSATE SODIUM 8.6-50 MG PO TABS: 2.0000 | ORAL_TABLET | ORAL | Status: DC

## 2021-03-02 MED ORDER — IBUPROFEN 600 MG PO TABS
600.0000 mg | ORAL_TABLET | Freq: Four times a day (QID) | ORAL | Status: DC
  Administered 2021-03-02: 600 mg via ORAL
  Filled 2021-03-02: qty 1

## 2021-03-02 MED ORDER — DIPHENHYDRAMINE HCL 25 MG PO CAPS: 25.0000 mg | ORAL_CAPSULE | Freq: Four times a day (QID) | ORAL | Status: DC | PRN

## 2021-03-02 MED ORDER — FERROUS SULFATE 325 (65 FE) MG PO TABS
325.0000 mg | ORAL_TABLET | Freq: Two times a day (BID) | ORAL | Status: DC
  Filled 2021-03-02: qty 1

## 2021-03-02 MED ORDER — BENZOCAINE-MENTHOL 20-0.5 % EX AERO
INHALATION_SPRAY | CUTANEOUS | Status: AC
  Filled 2021-03-02: qty 56

## 2021-03-02 MED ORDER — LACTATED RINGERS IV SOLN: INTRAVENOUS | Status: DC

## 2021-03-02 MED ORDER — SOD CITRATE-CITRIC ACID 500-334 MG/5ML PO SOLN: 30.0000 mL | ORAL | Status: DC | PRN

## 2021-03-02 MED ORDER — COCONUT OIL OIL
1.0000 "application " | TOPICAL_OIL | Status: DC | PRN
  Filled 2021-03-02: qty 120

## 2021-03-02 MED ORDER — WITCH HAZEL-GLYCERIN EX PADS
MEDICATED_PAD | CUTANEOUS | Status: AC
  Filled 2021-03-02: qty 100

## 2021-03-02 MED ORDER — BENZOCAINE-MENTHOL 20-0.5 % EX AERO: 1.0000 "application " | INHALATION_SPRAY | CUTANEOUS | Status: DC | PRN

## 2021-03-02 MED ORDER — SIMETHICONE 80 MG PO CHEW: 80.0000 mg | CHEWABLE_TABLET | ORAL | Status: DC | PRN

## 2021-03-02 MED ORDER — FENTANYL CITRATE (PF) 100 MCG/2ML IJ SOLN
25.0000 ug | INTRAMUSCULAR | Status: DC | PRN
  Administered 2021-03-02: 25 ug via INTRAVENOUS

## 2021-03-02 MED ORDER — HYDROCODONE-ACETAMINOPHEN 5-325 MG PO TABS: 1.0000 | ORAL_TABLET | Freq: Three times a day (TID) | ORAL | Status: DC | PRN

## 2021-03-02 MED ORDER — HYDROCORTISONE ACE-PRAMOXINE 1-1 % EX CREA
TOPICAL_CREAM | Freq: Three times a day (TID) | CUTANEOUS | Status: DC | PRN
  Filled 2021-03-02: qty 30

## 2021-03-02 MED ORDER — OXYTOCIN 10 UNIT/ML IJ SOLN
10.0000 [IU] | Freq: Once | INTRAMUSCULAR | Status: DC
Start: 2021-03-02 — End: 2021-03-02
  Filled 2021-03-02: qty 1

## 2021-03-02 MED ORDER — PRENATAL MULTIVITAMIN CH: 1.0000 | ORAL_TABLET | Freq: Every day | ORAL | Status: DC

## 2021-03-02 MED ORDER — LACTATED RINGERS IV SOLN: 500.0000 mL | INTRAVENOUS | Status: DC | PRN

## 2021-03-02 MED ORDER — OXYTOCIN-SODIUM CHLORIDE 30-0.9 UT/500ML-% IV SOLN: 2.5000 [IU]/h | INTRAVENOUS | Status: DC

## 2021-03-02 MED ORDER — DIBUCAINE (PERIANAL) 1 % EX OINT: 1.0000 "application " | TOPICAL_OINTMENT | CUTANEOUS | 1 refills | Status: DC | PRN

## 2021-03-02 MED ORDER — IBUPROFEN 600 MG PO TABS: 600.0000 mg | ORAL_TABLET | Freq: Four times a day (QID) | ORAL | 0 refills | Status: DC

## 2021-03-02 NOTE — ED Notes (Signed)
Covid swab sent to the lab. Pt taken to L&D at this time.

## 2021-03-02 NOTE — Discharge Summary (Signed)
Postpartum Discharge Summary     Patient Name: Theresa Knight DOB: 1995/12/08 MRN: [id number]  Date of admission: 03/02/2021 Delivery date:03/02/2021  Delivering provider: Prentice Docker D  Date of discharge: 03/02/2021  Admitting diagnosis: Normal labor [O80, Z37.9] Intrauterine pregnancy: [redacted]w[redacted]d    Secondary diagnosis:  Principal Problem:   Normal labor Active Problems:   [redacted] weeks gestation of pregnancy  Additional problems: none    Discharge diagnosis: Term Pregnancy Delivered                                              Post partum procedures: None Augmentation: AROM Complications: None  Hospital course: Onset of Labor With Vaginal Delivery      26y.o. yo G2P1001 at 328w6das admitted in Active Labor on 03/02/2021. Patient had an uncomplicated labor course as follows:  Membrane Rupture Time/Date: 2:05 AM ,03/02/2021   Delivery Method:Vaginal, Spontaneous  Episiotomy: None  Lacerations:  None  Patient had an uncomplicated postpartum course.  She is ambulating, tolerating a regular diet, passing flatus, and urinating well. Patient is discharged home in stable condition on 03/02/21.  Newborn Data: Birth date:03/02/2021  Birth time:4:42 AM  Gender:Female  Living status:Living  Apgars:8 ,9  Weight:3130 g   Magnesium Sulfate received: No BMZ received: No Rhophylac:N/A MMR: Declines T-DaP: declines Flu: No Transfusion:No  Physical exam  Vitals:   03/02/21 0713 03/02/21 1107 03/02/21 1200 03/02/21 1515  BP: 125/69 133/85  104/62  Pulse: 92 85  97  Resp: '16 16  16  ' Temp: 97.9 F (36.6 C)  98.1 F (36.7 C) 98.3 F (36.8 C)  TempSrc: Oral  Oral Oral  Weight:      Height:       General: alert, cooperative, and no distress Lochia: appropriate Uterine Fundus: firm Perineum: minimal edema, intact  DVT Evaluation: No evidence of DVT seen on physical exam. Labs: Lab Results  Component Value Date   WBC 18.9 (H) 03/02/2021   HGB 12.7 03/02/2021   HCT 36.0  03/02/2021   MCV 94.5 03/02/2021   PLT 285 03/02/2021   CMP Latest Ref Rng & Units 01/30/2016  Glucose 65 - 99 mg/dL 86  BUN 6 - 20 mg/dL 10  Creatinine 0.57 - 1.00 mg/dL 0.62  Sodium 134 - 144 mmol/L 141  Potassium 3.5 - 5.2 mmol/L 4.3  Chloride 96 - 106 mmol/L 101  CO2 18 - 29 mmol/L 25  Calcium 8.7 - 10.2 mg/dL 9.4  Total Protein 6.0 - 8.5 g/dL 6.8  Total Bilirubin 0.0 - 1.2 mg/dL 0.3  Alkaline Phos 39 - 117 IU/L 59  AST 0 - 40 IU/L 16  ALT 0 - 32 IU/L 19   Edinburgh Score: Edinburgh Postnatal Depression Scale Screening Tool 03/02/2021  I have been able to laugh and see the funny side of things. 0  I have looked forward with enjoyment to things. 0  I have blamed myself unnecessarily when things went wrong. 0  I have been anxious or worried for no good reason. 0  I have felt scared or panicky for no good reason. 0  Things have been getting on top of me. 0  I have been so unhappy that I have had difficulty sleeping. 0  I have felt sad or miserable. 0  I have been so unhappy that I have been crying. 0  The thought of harming myself has occurred to me. 0  Edinburgh Postnatal Depression Scale Total 0      After visit meds:  Allergies as of 03/02/2021   No Known Allergies      Medication List     TAKE these medications    acetaminophen 325 MG tablet Commonly known as: Tylenol Take 2 tablets (650 mg total) by mouth every 4 (four) hours as needed (for pain scale < 4).   dibucaine 1 % Oint Commonly known as: NUPERCAINAL Place 1 application rectally as needed for hemorrhoids.   ibuprofen 600 MG tablet Commonly known as: ADVIL Take 1 tablet (600 mg total) by mouth every 6 (six) hours.   multivitamin-prenatal 27-0.8 MG Tabs tablet Take 1 tablet by mouth daily at 12 noon.   promethazine 25 MG tablet Commonly known as: PHENERGAN Take 1 tablet (25 mg total) by mouth every 6 (six) hours as needed for nausea or vomiting.         Discharge home in stable  condition Infant Feeding: Breast Infant Disposition:home with mother Discharge instruction: per After Visit Summary and Postpartum booklet. Activity: Advance as tolerated. Pelvic rest for 6 weeks.  Diet: routine diet Anticipated Birth Control:  LAM and NFP Postpartum Appointment:6 weeks -Will see CNM tomorrow for 24 hour postpartum check.  Plan to follow with Vianne Bulls, CNM for postpartum care.  -May see [place] for concerns postpartum if desired.  Additional Postpartum F/U:  none Future Appointments:No future appointments. Follow up Visit:  Follow-up Information     Vianne Bulls, CNM. Schedule an appointment as soon as possible for a visit in 6 week(s).   Why: postpartum visit.  May be seen by [facility] postpartum for concerns if preferred. Postpartum home visit scheduled by CNM tomorrow.                SIGNED:  Drinda Butts, CNM Certified Nurse Midwife [facility]

## 2021-03-02 NOTE — H&P (Signed)
OB History & Physical   History of Present Illness:  Chief Complaint: regular uterine contractions, stalled labor  HPI:  Theresa Knight is a 26 y.o. G2P1001 female at [redacted]w[redacted]d dated by LMP.  Her pregnancy has been uncomplicated.    She reports contractions that started in earnest at about 630 AM yesterday.   She denies leakage of fluid.   She denies vaginal bleeding.   She reports fetal movement.    Total weight gain for pregnancy: Not found.   Obstetrical Problem List: pregnancy Problems (from 12/09/20 to present)     No problems associated with this episode.        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 Authorizing Provider  promethazine (PHENERGAN) 25 MG tablet Take 1 tablet (25 mg total) by mouth every 6 (six) hours as needed for nausea or vomiting. 08/13/20   Will Bonnet, MD    OB History  Gravida Para Term Preterm AB Living  2 1 1  0 0 1  SAB IAB Ectopic Multiple Live Births  0 0 0 0 1    # Outcome Date GA Lbr Len/2nd Weight Sex Delivery Anes PTL Lv  2 Current           1 Term 08/28/17 [redacted]w[redacted]d / 01:03 3160 g M Vag-Spont None  LIV    Prenatal care site: Mostly through and at-home midwife, coordinated parallel care with me at first while I was at [facility], then at [facility] most recently.    Social History: She  reports that she has never smoked. She has never used smokeless tobacco. She reports that she does not drink alcohol and does not use drugs.  Family History: family history includes Diabetes in her paternal grandmother; Heart disease in her paternal grandmother.    Review of Systems  Constitutional: Negative.   HENT: Negative.    Eyes: Negative.   Respiratory: Negative.    Cardiovascular: Negative.   Gastrointestinal:  Positive for abdominal pain. Negative for blood in stool, constipation, diarrhea, heartburn, nausea and vomiting.  Genitourinary: Negative.   Musculoskeletal: Negative.   Skin: Negative.   Neurological: Negative.   Psychiatric/Behavioral: Negative.      Physical Exam:  LMP 06/10/2020 (Exact Date)   Physical Exam Constitutional:      General: She is not in acute distress.    Appearance: Normal appearance. She is well-developed.  Genitourinary:     Genitourinary Comments: 6/80/-2/mid  --  AROM clear, abundant fluid  HENT:     Head: Normocephalic and atraumatic.  Eyes:     General: No scleral icterus.    Conjunctiva/sclera: Conjunctivae normal.  Cardiovascular:     Rate and Rhythm: Normal rate and regular rhythm.     Heart sounds: No murmur heard.   No friction rub. No gallop.  Pulmonary:     Effort: Pulmonary effort is normal. No respiratory distress.     Breath sounds: Normal breath sounds. No wheezing or rales.  Abdominal:     General: Bowel sounds are normal. There is no distension.     Palpations: Abdomen is soft. There is mass (gravid, appropriately ttp).     Tenderness: There is no abdominal tenderness. There is no guarding or rebound.  Musculoskeletal:        General: Normal range of motion.     Cervical back: Normal range of motion and neck supple.  Neurological:     General: No focal deficit present.     Mental Status: She is alert and oriented to person, place, and time.     Cranial Nerves: No cranial nerve deficit.   Skin:    General: Skin is warm and dry.     Findings: No erythema.  Psychiatric:        Mood and Affect: Mood normal.        Behavior: Behavior normal.        Judgment: Judgment normal.    Female chaperone present for pelvic exam:   Pertinent Results:  Prenatal Labs Blood type/Rh O positive  Antibody screen negative  Rubella Equivocal  Varicella Immune    RPR NR  HBsAg negative  HIV negative  GC negative  Chlamydia negative  Genetic screening Declined  1 hour GTT 112  3 hour GTT N/a  GBS negative on 02/06/2021   Baseline FHR: 135 beats/min   Variability: moderate   Accelerations: present   Decelerations: present Contractions: present frequency: 3-4 q 10 min Overall assessment: cat 1  Bedside Ultrasound:  Number of Fetus: 1  Presentation: cephalic  Fluid: high-normal  Placental Location: posterior  Covid - pending  Assessment:  LASHAWNDRA Knight is a 26 y.o. G65P1001 female at [redacted]w[redacted]d with normal labor with stalled labor, now AROM.   Plan:  Admit to Labor & Delivery  CBC, T&S, Clrs, IVF GBS negative.   Fetwal well-being: reassuring AROM to help with labor process.  Augment, if needed.    Prentice Docker, MD 03/02/2021 2:37 AM

## 2021-03-02 NOTE — Progress Notes (Signed)
Postpartum Day  0  Subjective: no complaints, up ad lib, voiding, and tolerating PO -reports painful after birth cramping  -Using heating pad   Doing well, no concerns. Ambulating without difficulty, pain managed with PO meds, tolerating regular diet, and voiding without difficulty.   No fever/chills, chest pain, shortness of breath, nausea/vomiting, or leg pain. No nipple or breast pain. No headache, visual changes, or RUQ/epigastric pain.  Objective: BP 133/85    Pulse 85    Temp 97.9 F (36.6 C) (Oral)    Resp 16    Ht 5\' 1"  (1.549 m)    Wt 64 kg    LMP 06/10/2020 (Exact Date)    Breastfeeding Unknown    BMI 26.64 kg/m    Physical Exam:  General: alert, cooperative, and no distress Breasts: soft/nontender CV: RRR Pulm: nl effort, CTABL Abdomen: soft, non-tender, active bowel sounds Uterine Fundus: firm Perineum: minimal edema, intact Lochia: appropriate DVT Evaluation: No evidence of DVT seen on physical exam.  Recent Labs    03/02/21 0236  HGB 12.7  HCT 36.0  WBC 18.9*  PLT 285    Assessment/Plan: 26 y.o. G2P2002 postpartum day # 0  -Continue routine postpartum care -Lactation consult PRN for breastfeeding  -Discussed contraceptive options. Desires NFP -Immunization status:  declines immunizations   Disposition: Continue inpatient postpartum care. Desires discharge home today.    LOS: 0 days   Minda Meo, [place] 03/02/2021, 12:25 PM   ----- Drinda Butts  Certified Nurse Midwife [facility]

## 2021-03-02 NOTE — Progress Notes (Signed)
Pt given discharge instructions including follow up care. Pt to be seen by CNM tomorrow at home. Pt discharged in stable condition with husband and baby.

## 2021-06-26 ENCOUNTER — Telehealth: Payer: Self-pay

## 2021-06-26 NOTE — Telephone Encounter (Signed)
Telephone Call: ? ?Patient called Lactation Services telephone number to request tips and strategies to manage her clogged duct located behind her areolar. Per her report she has another bout of mastitis and her Provider has prescribed antibiotics. Patient has attempted breastfeeding in several positions including "dangle feeding" without improvement in her breast discomfort or milk flow. She has also tried applying warmth to her breast and that has not helped.  ? ?Recommended patient use ice packs to breast and to recline prior to offering baby to breastfeed. Provided patient updated information, tips and strategies for management of mastitis from [employer].  ? ?After 1 hour I followed up with patient who reported she did experience some relief after using ice packs, reclining, and breastfeeding. ? ?Jerilynn Som RN, IBCLC ?

## 2021-07-10 IMAGING — NM NM HEPATO W/GB/PHARM/[PERSON_NAME]
2 series · 12 of 12 positions shown · non-contrast
Comparison: Right upper quadrant ultrasound 03/15/2019

CLINICAL DATA: Right upper quadrant pain intermittently with nausea
and vomiting since [REDACTED].

EXAM:
NUCLEAR MEDICINE HEPATOBILIARY IMAGING WITH GALLBLADDER EF
TECHNIQUE: Sequential images of the abdomen were obtained [DATE] minutes
following intravenous administration of radiopharmaceutical. After
oral ingestion of Ensure, gallbladder ejection fraction was
determined. At 60 min, normal ejection fraction is greater than 33%.
RADIOPHARMACEUTICALS:  5.39 mCi 8c-PPm  Choletec IV

[Series 1000: hepatobiliary scan · 9.59mm/px · 6 of 60 frames shown]
[frame 6/60]
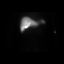
[frame 16/60]
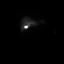
[frame 26/60]
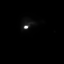
[frame 36/60]
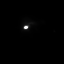
[frame 46/60]
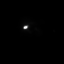
[frame 56/60]
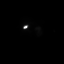

[Series 1000: gallbladder ef · 4.80mm/px · 6 of 120 frames shown]
[frame 11/120]
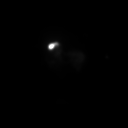
[frame 31/120]
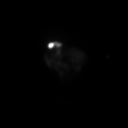
[frame 51/120]
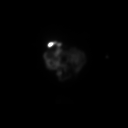
[frame 71/120]
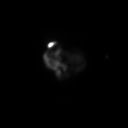
[frame 91/120]
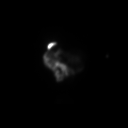
[frame 111/120]
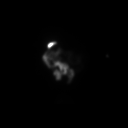

[12 of 12 positions shown; findings below may reference images not displayed]

FINDINGS: Prompt uptake and biliary excretion of activity by the liver is
seen. Gallbladder activity is visualized at 10-15 minutes,
consistent with patency of cystic duct. Biliary activity passes into
small bowel by 45 minutes, consistent with patent common bile duct.

Calculated gallbladder ejection fraction is 65%. (Normal gallbladder
ejection fraction with Ensure is greater than 33%.)
IMPRESSION: Normal HIDA scan with normal gallbladder ejection fraction of 65%.

## 2022-12-15 IMAGING — US US OB COMP +14 WK
1 series · 13 of 28 positions shown · non-contrast
Comparison: none

CLINICAL DATA: 25-year-old pregnant female presents for fetal
anatomic survey.

EXAM:
OBSTETRICAL ULTRASOUND >14 WKS

[Series 1: us ob comp + 14 wk · 13 of 76 slices shown]
[im 3/76]
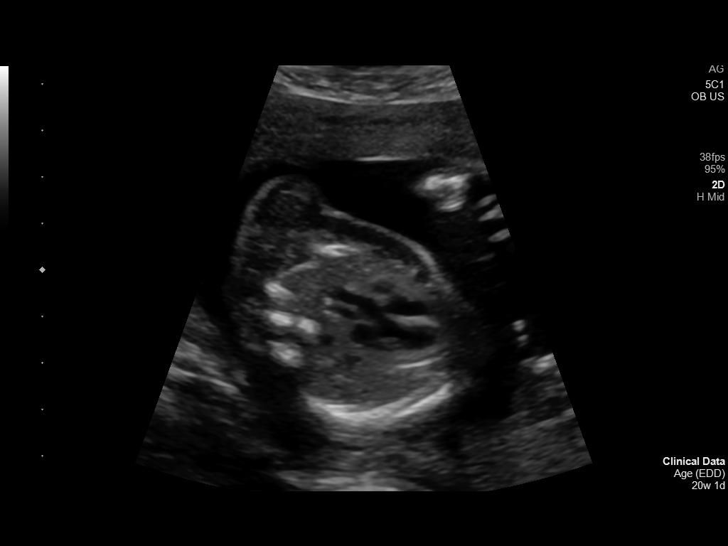
[im 9/76]
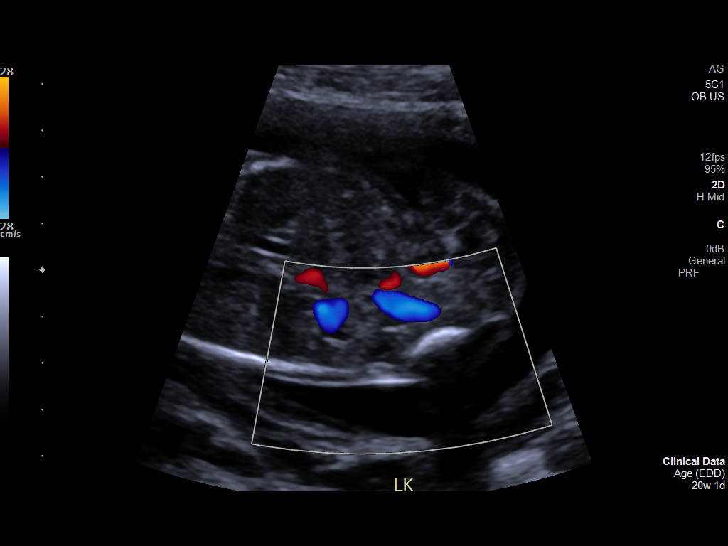
[im 14/76]
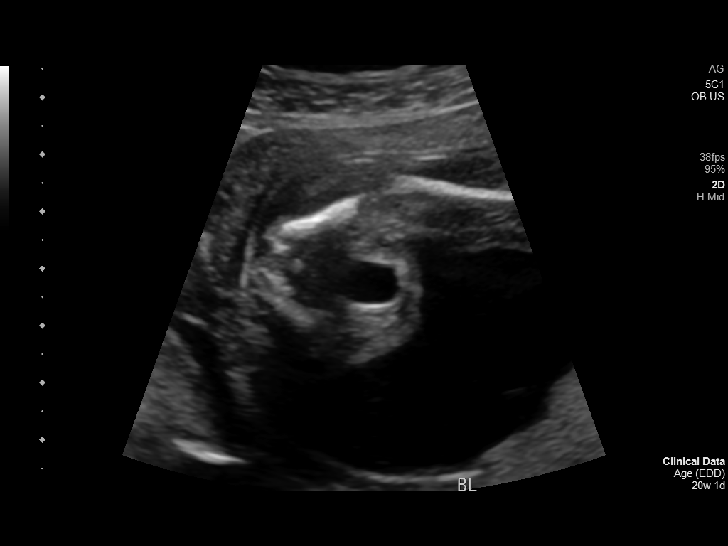
[im 20/76]
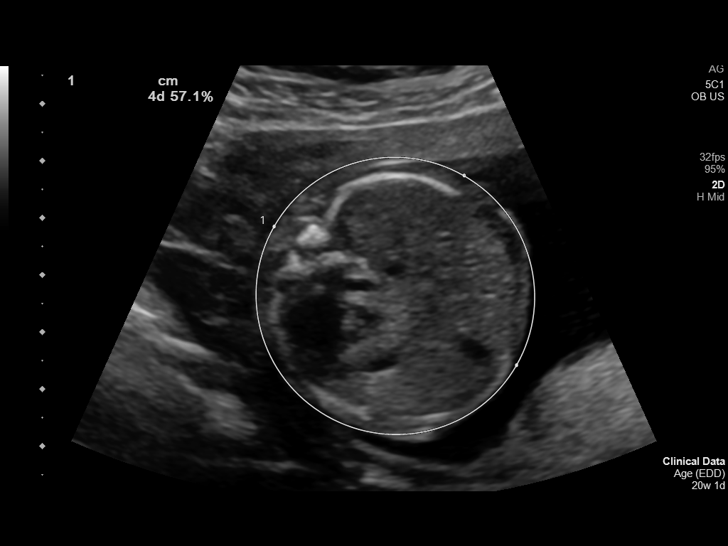
[im 26/76]
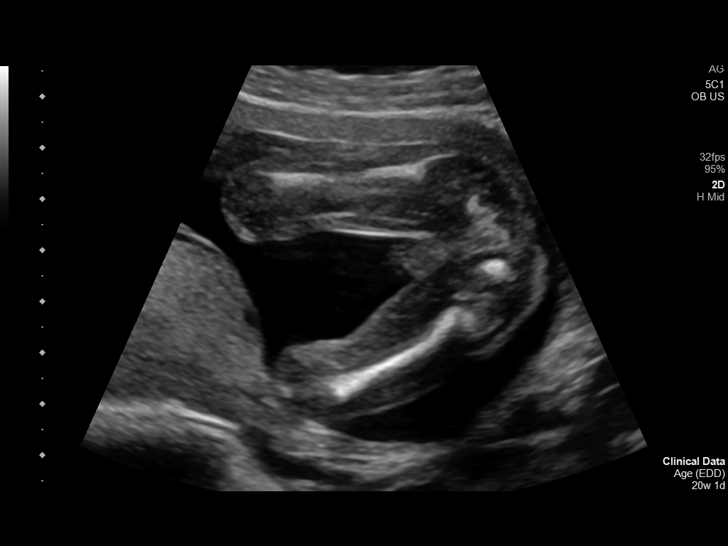
[im 31/76]
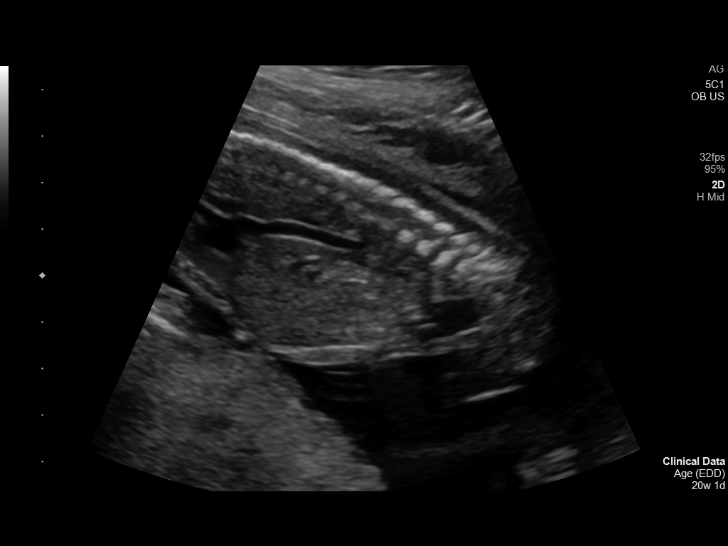
[im 39/76]
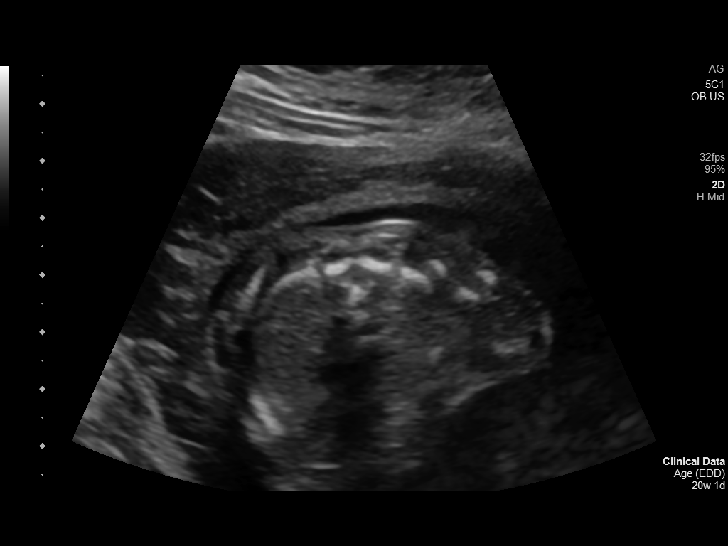
[im 45/76]
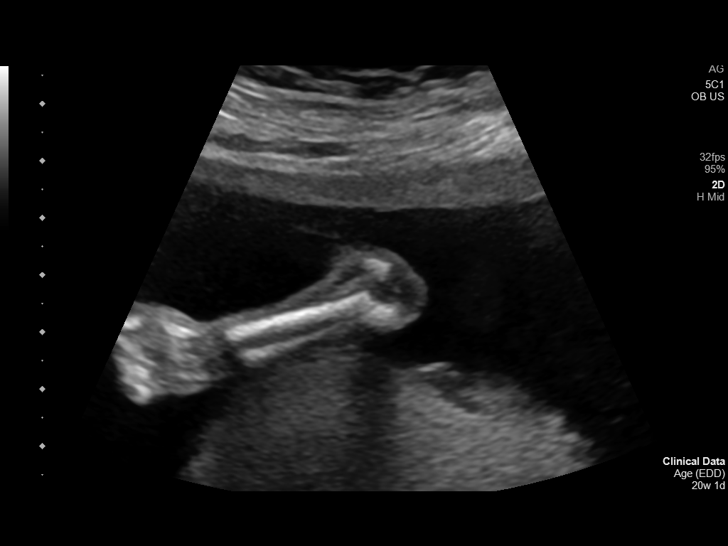
[im 51/76]
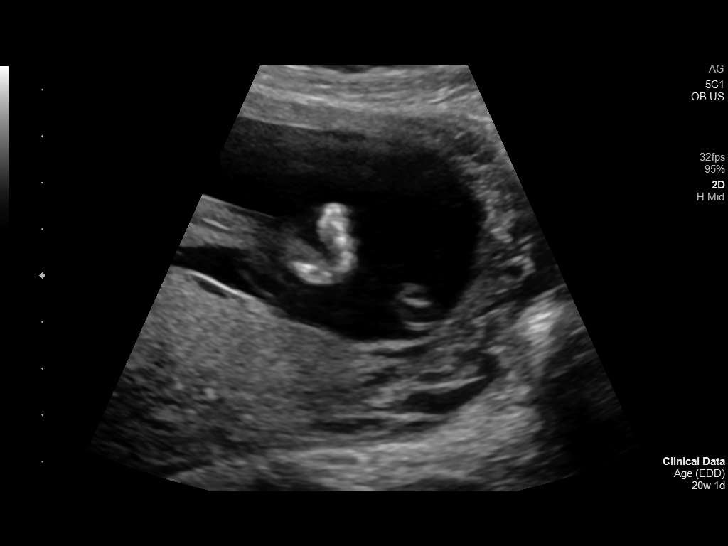
[im 56/76]
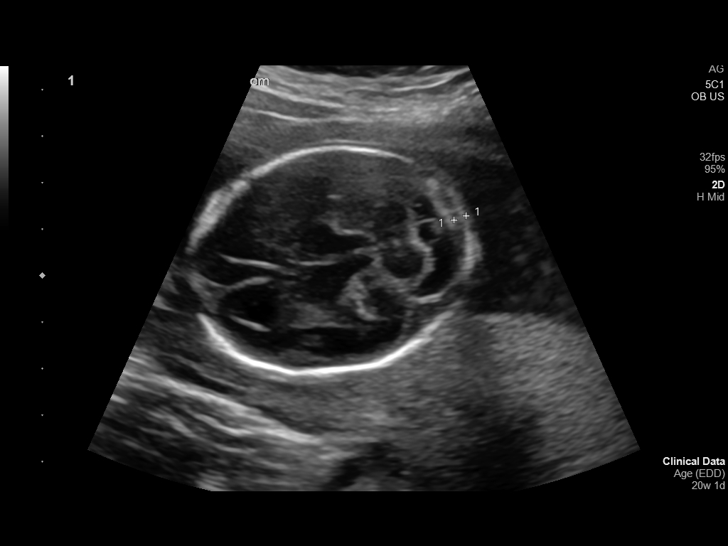
[im 62/76]
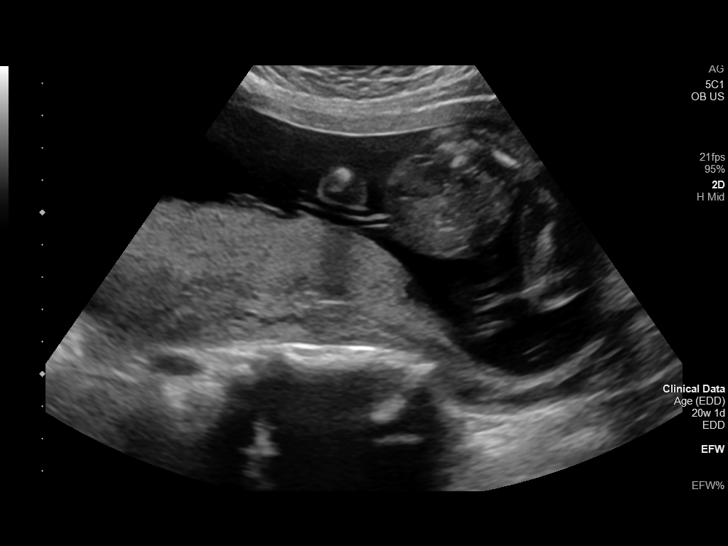
[im 67/76]
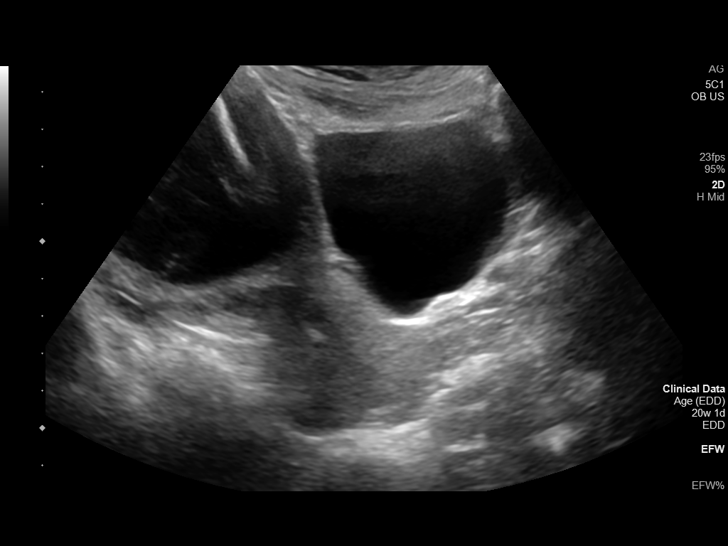
[im 73/76]
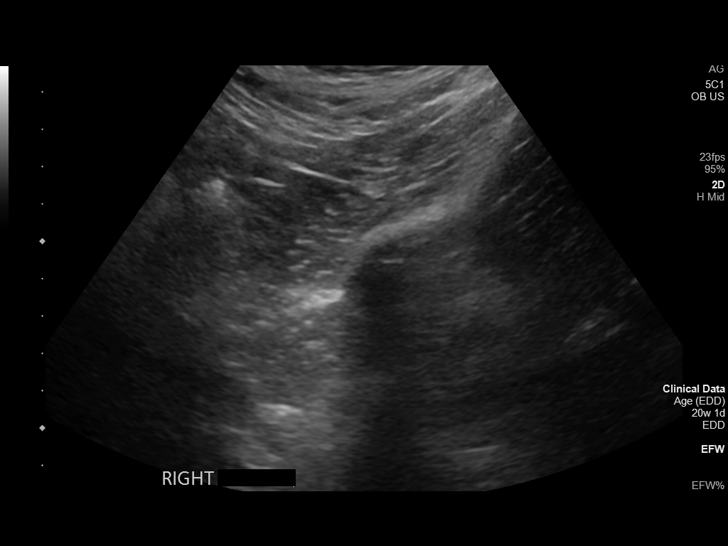

[13 of 28 positions shown; findings below may reference images not displayed]

FINDINGS: Number of Fetuses: 1

Heart Rate:  133 bpm

Movement: Yes

Presentation: Breech

Previa: No

Placental Location: Posterior

Amniotic Fluid (Subjective): Normal

Amniotic Fluid (Objective):

Vertical pocket = 3.9cm

FETAL BIOMETRY

BPD: 4.7cm 20w 2d

HC:   17.8cm 20w 2d

AC:   15.3cm 20w 4d

FL:   3.3cm 20w 2d

Current Mean GA: 20w 3d US EDC: 03/15/2021

Assigned GA:  20w 1d Assigned EDC: 03/17/2021

Estimated Fetal Weight:  350g 59%ile

FETAL ANATOMY

Lateral Ventricles: Appears normal

Thalami/CSP: Appears normal

Posterior Fossa:  Appears normal

Nuchal Region: Appears normal   NFT= 2.8 mm

Upper Lip: Appears normal

Spine: Appears normal

4 Chamber Heart on Left: Appears normal

LVOT: Appears normal

RVOT: Appears normal

Stomach on Left: Appears normal

3 Vessel Cord: Appears normal

Cord Insertion site: Appears normal

Kidneys: Appears normal

Bladder: Appears normal

Extremities: Appears normal, 4 extremities demonstrated

Sex: Male external genitalia

Maternal Findings:

Cervix: Cervix length approximately 3.9 cm on transabdominal views
with no evidence of internal cervical funneling.
IMPRESSION: 1. Single living intrauterine gestation in breech lie at 20 weeks 3
days by average ultrasound date, concordant with assigned dating.
2. No fetal or maternal abnormalities detected.

## 2023-06-22 ENCOUNTER — Other Ambulatory Visit: Payer: Self-pay | Admitting: Obstetrics and Gynecology

## 2023-06-22 DIAGNOSIS — O365990 Maternal care for other known or suspected poor fetal growth, unspecified trimester, not applicable or unspecified: Secondary | ICD-10-CM

## 2023-06-23 ENCOUNTER — Encounter: Payer: Self-pay | Admitting: *Deleted

## 2023-06-23 DIAGNOSIS — O365990 Maternal care for other known or suspected poor fetal growth, unspecified trimester, not applicable or unspecified: Secondary | ICD-10-CM | POA: Insufficient documentation

## 2023-06-30 ENCOUNTER — Ambulatory Visit: Attending: Obstetrics and Gynecology

## 2023-06-30 ENCOUNTER — Other Ambulatory Visit: Payer: Self-pay | Admitting: Obstetrics and Gynecology

## 2023-06-30 ENCOUNTER — Ambulatory Visit (HOSPITAL_BASED_OUTPATIENT_CLINIC_OR_DEPARTMENT_OTHER): Payer: Self-pay | Admitting: Maternal & Fetal Medicine

## 2023-06-30 ENCOUNTER — Ambulatory Visit: Admitting: *Deleted

## 2023-06-30 VITALS — BP 119/67 | HR 86

## 2023-06-30 DIAGNOSIS — Z3A30 30 weeks gestation of pregnancy: Secondary | ICD-10-CM | POA: Diagnosis present

## 2023-06-30 DIAGNOSIS — O365930 Maternal care for other known or suspected poor fetal growth, third trimester, not applicable or unspecified: Secondary | ICD-10-CM

## 2023-06-30 DIAGNOSIS — O365990 Maternal care for other known or suspected poor fetal growth, unspecified trimester, not applicable or unspecified: Secondary | ICD-10-CM

## 2023-06-30 NOTE — Progress Notes (Signed)
 MFM consultation  Theresa Knight is a 28 yo G3P2 who is seen at 52 w k 3 d with an EDD of 09/05/23.  She is seen at the request of Dr. Hinton Luis Knight due to fetal growth restriction. She had a ultrasound exam at her providers office demonstrating an EFW of 9% with an AC at the 6th% normal UAD.  She has no risk factors for FGR. She had two prior children ~ 6 lbs + and [name] is small as well.      06/30/2023   11:04 AM 03/02/2021    3:15 PM 03/02/2021   11:07 AM  Vitals with BMI  Systolic 119 104 696  Diastolic 67 62 85  Pulse 86 97 85   OB History  Gravida Para Term Preterm AB Living  3 2 2  0 0 2  SAB IAB Ectopic Multiple Live Births  0 0 0 0 2    # Outcome Date GA Lbr Len/2nd Weight Sex Type Anes PTL Lv  3 Current           2 Term 03/02/21 [redacted]w[redacted]d / 00:17 6 lb 14.4 oz (3.13 kg) M Vag-Spont None  LIV     Name: Theresa Knight     Apgar1: 8  Apgar5: 9  1 Term 08/28/17 [redacted]w[redacted]d / 01:03 6 lb 15.5 oz (3.16 kg) M Vag-Spont None  LIV     Name: Theresa Knight      Apgar1: 8  Apgar5: 9   Past Medical History:  Diagnosis Date   Endometriosis    PCOS (polycystic ovarian syndrome)    Seizures ([facility]) July 2016   pseudo seizures   Past Surgical History:  Procedure Laterality Date   CYSTOSCOPY N/A 11/10/2014   Procedure: CYSTOSCOPY;  Surgeon: Darl Edu, MD;  Location: [facility] ORS;  Service: Gynecology;  Laterality: N/A;   KNEE ARTHROSCOPY Bilateral 2009, 2011   LAPAROSCOPY N/A 11/10/2014   Procedure: LAPAROSCOPY DIAGNOSTIC with biopsies;  Surgeon: Darl Edu, MD;  Location: [facility] ORS;  Service: Gynecology;  Laterality: N/A;   WISDOM TOOTH EXTRACTION     Social History   Socioeconomic History   Marital status: Married    Spouse name: Not on file   Number of children: Not on file   Years of education: Not on file   Highest education level: Not on file  Occupational History   Not on file  Tobacco Use   Smoking status: Never   Smokeless tobacco: Never  Vaping Use   Vaping  status: Never Used  Substance and Sexual Activity   Alcohol use: No   Drug use: No   Sexual activity: Yes    Birth control/protection: None  Other Topics Concern   Not on file  Social History Narrative   Pt lives in 2 story home with her husband and 2 dogs   Currently pregnant with her first child   Has a general education associates degree   Current nursing student   Social Drivers of Health   Financial Resource Strain: Low Risk  (04/13/2023)   Received from [facility]   Overall Financial Resource Strain (CARDIA)    Difficulty of Paying Living Expenses: Not hard at all  Food Insecurity: No Food Insecurity (04/13/2023)   [facility]   Hunger Vital Sign    Worried About Running Out of Food in the Last Year: Never true    Ran Out of Food in the Last Year: Never true  Transportation Needs: No Transportation  Needs (04/13/2023)   Received from [facility] - Transportation    In the past 12 months, has lack of transportation kept you from medical appointments or from getting medications?: No    Lack of Transportation (Non-Medical): No  Physical Activity: Not on file  Stress: Not on file  Social Connections: Not on file  Intimate Partner Violence: Not on file         Current Outpatient Medications (Other):    Prenatal Vit-Fe Fumarate-FA (MULTIVITAMIN-PRENATAL) 27-0.8 MG TABS tablet, Take 1 tablet by mouth daily at 12 noon.  Imaging: Single intrauterine pregnancy with measurements consistent with FGR EFW 7th% AC 13% There are no markers for aneuploidy. Good fetal movement and amniotic fluid noted. There is normal UAD without evidence of AEDF or REDF. NST was reactive today Biophysical profile 10/10  Impression/Counseling: I discussed today's visit with a diagnosis of IUGR. I explained that the etiology includes placental insufficiency, chronic disease, infection, aneuploidy and other genetic  syndromes.   She declined genetic screening:  She has no additional risk factors for chronic disease.   At this time I explained the diagnosis, evaluation and management to include serial fetal growth and weekly antenatal testing to include UA Dopplers.   If the EFW < 3rd% or abnormal testingis present, I recommend delivery at 37 weeks otherwise consider delivery at 39 weeks.   [name] expressed her plan for a home birth. I conveyed if the fetal growth and/or the [facility] is < 10th% I would not recommend a home birth  She was not scheduled for further appointments at this time and will continue serial testing and growth at your offices.  I spent 60 minutes with > 50% in face to face consultation and medical record review.  Tonya Fredrickson, MD

## 2023-06-30 NOTE — Procedures (Signed)
 Theresa Knight 1996-01-15 [redacted]w[redacted]d  Fetus A Non-Stress Test Interpretation for 06/30/23-NST with BPP & UAD  Indication: IUGR  Fetal Heart Rate A Mode: External Baseline Rate (A): 140 bpm Variability: Moderate Accelerations: 15 x 15 Decelerations: None Multiple birth?: No  Uterine Activity Mode: Toco Contraction Frequency (min): none Resting Tone Palpated: Relaxed  Interpretation (Fetal Testing) Nonstress Test Interpretation: Reactive Comments: Traicng reviewed by Dr. Arcola Kocher

## 2023-07-03 ENCOUNTER — Other Ambulatory Visit: Payer: Self-pay | Admitting: Obstetrics and Gynecology

## 2023-07-03 DIAGNOSIS — O365990 Maternal care for other known or suspected poor fetal growth, unspecified trimester, not applicable or unspecified: Secondary | ICD-10-CM

## 2023-07-03 DIAGNOSIS — O0993 Supervision of high risk pregnancy, unspecified, third trimester: Secondary | ICD-10-CM

## 2023-07-09 ENCOUNTER — Other Ambulatory Visit: Payer: Self-pay | Admitting: *Deleted

## 2023-07-09 ENCOUNTER — Ambulatory Visit (HOSPITAL_BASED_OUTPATIENT_CLINIC_OR_DEPARTMENT_OTHER): Admitting: Obstetrics

## 2023-07-09 ENCOUNTER — Other Ambulatory Visit: Payer: Self-pay | Admitting: Obstetrics and Gynecology

## 2023-07-09 ENCOUNTER — Ambulatory Visit: Attending: Obstetrics and Gynecology

## 2023-07-09 VITALS — BP 120/63 | HR 70

## 2023-07-09 DIAGNOSIS — O365930 Maternal care for other known or suspected poor fetal growth, third trimester, not applicable or unspecified: Secondary | ICD-10-CM | POA: Diagnosis present

## 2023-07-09 DIAGNOSIS — O0993 Supervision of high risk pregnancy, unspecified, third trimester: Secondary | ICD-10-CM

## 2023-07-09 DIAGNOSIS — O365990 Maternal care for other known or suspected poor fetal growth, unspecified trimester, not applicable or unspecified: Secondary | ICD-10-CM | POA: Insufficient documentation

## 2023-07-09 DIAGNOSIS — Z3A31 31 weeks gestation of pregnancy: Secondary | ICD-10-CM | POA: Diagnosis present

## 2023-07-09 NOTE — Progress Notes (Signed)
 MFM Consult Note  Theresa Knight is currently at 31 weeks and 5 days.  She has been followed due to IUGR.    She denies any problems since her last exam and reports feeling vigorous fetal movements throughout the day.  A BPP performed today was 8/8.    There was normal amniotic fluid noted with a total AFI of 17.48 cm.  Doppler studies of the umbilical arteries performed due to fetal growth restriction showed a normal S/D ratio of 3.14.  There were no signs of absent or reversed end-diastolic flow noted today.  Due to IUGR, another BPP and umbilical artery Doppler study was scheduled in our office in 1 week.  We will reassess the fetal growth in 2 weeks.    The patient may have these tests performed in your office if a sonographer is available.  The patient is planning home birth.  She understands that should IUGR continue to be noted later in her pregnancy, that a home birth is not advisable.    She stated that all of her questions were answered today.  A total of 20 minutes was spent counseling and coordinating the care for this patient.  Greater than 50% of the time was spent in direct face-to-face contact.

## 2023-07-15 ENCOUNTER — Ambulatory Visit: Attending: Obstetrics

## 2023-07-15 ENCOUNTER — Other Ambulatory Visit: Payer: Self-pay

## 2023-07-15 DIAGNOSIS — Z362 Encounter for other antenatal screening follow-up: Secondary | ICD-10-CM | POA: Insufficient documentation

## 2023-07-15 DIAGNOSIS — O321XX0 Maternal care for breech presentation, not applicable or unspecified: Secondary | ICD-10-CM | POA: Diagnosis not present

## 2023-07-15 DIAGNOSIS — O365930 Maternal care for other known or suspected poor fetal growth, third trimester, not applicable or unspecified: Secondary | ICD-10-CM | POA: Diagnosis not present

## 2023-07-15 DIAGNOSIS — Z3A32 32 weeks gestation of pregnancy: Secondary | ICD-10-CM | POA: Insufficient documentation

## 2023-07-22 ENCOUNTER — Ambulatory Visit

## 2023-07-22 ENCOUNTER — Other Ambulatory Visit: Payer: Self-pay

## 2023-07-22 ENCOUNTER — Ambulatory Visit: Admitting: Maternal & Fetal Medicine

## 2023-07-22 ENCOUNTER — Ambulatory Visit: Attending: Maternal & Fetal Medicine

## 2023-07-22 VITALS — BP 115/70 | HR 76

## 2023-07-22 DIAGNOSIS — O321XX0 Maternal care for breech presentation, not applicable or unspecified: Secondary | ICD-10-CM

## 2023-07-22 DIAGNOSIS — Z362 Encounter for other antenatal screening follow-up: Secondary | ICD-10-CM | POA: Diagnosis not present

## 2023-07-22 DIAGNOSIS — O365930 Maternal care for other known or suspected poor fetal growth, third trimester, not applicable or unspecified: Secondary | ICD-10-CM

## 2023-07-22 DIAGNOSIS — Z3A33 33 weeks gestation of pregnancy: Secondary | ICD-10-CM | POA: Diagnosis not present

## 2023-07-22 NOTE — Progress Notes (Signed)
   Patient information  Patient Name: Theresa Knight  Patient MRN:   [id number]  Referring practice: MFM Referring Provider: [facility]  Problem List   Patient Active Problem List   Diagnosis Date Noted   Breech presentation of fetus 07/22/2023   IUGR (intrauterine growth restriction) affecting care of mother 06/23/2023   Maternal Fetal medicine Consult  SHENG PRITZ is a 28 y.o. G3P2002 at [redacted]w[redacted]d here for ultrasound and consultation. Zaiyah L Tomlin is doing well today with no acute concerns. Today we focused on the following:   Fetal growth: Previously there was growth restriction noted on the previous growth ultrasound at the 8th percentile overall.  I discussed that the fetal biometry is normal today.  Typically we follow with weekly antenatal testing in the setting until the growth pattern is confirmed however, the patient is given birth to previous 6 pound newborns and this is likely a normal growth pattern for this patient.  The patient expressed the desire to have less intervention and will monitor fetal movement and follow-up in 3 to 4 weeks for another growth ultrasound.  We also discussed that the fetus is breech today.  She is going to try some spinning babies maneuvers and see if this will convert the fetus to cephalic.  If not she can discuss external cephalic version with her OB provider.  The patient strongly desires a vaginal birth but understands the limitations in the setting if the fetus is breech.  The patient had time to ask questions that were answered to her satisfaction.  She verbalized understanding and agrees to proceed with the plan below.  Sonographic findings Single intrauterine pregnancy. Fetal cardiac activity: Observed. Presentation: Breech. Interval fetal anatomy appears normal. Fetal biometry shows the estimated fetal weight at the 22 percentile. Amniotic fluid: Within normal limits.  MVP: 6.6 cm. Placenta:  Anterior. BPP: 8/8.   There are limitations of prenatal ultrasound such as the inability to detect certain abnormalities due to poor visualization. Various factors such as fetal position, gestational age and maternal body habitus may increase the difficulty in visualizing the fetal anatomy.    Recommendations -F/u growth US  in 3-4 weeks to assess presentation and biometry  Review of Systems: A review of systems was performed and was negative except per HPI   Vitals and Physical Exam    07/22/2023    2:30 PM 07/15/2023    2:30 PM 07/09/2023   11:07 AM  Vitals with BMI  Systolic 115 124 213  Diastolic 70 83 63  Pulse 76 83 70    Sitting comfortably on the sonogram table Nonlabored breathing Normal rate and rhythm Abdomen is nontender  Past pregnancies OB History  Gravida Para Term Preterm AB Living  3 2 2  0 0 2  SAB IAB Ectopic Multiple Live Births  0 0 0 0 2    # Outcome Date GA Lbr Len/2nd Weight Sex Type Anes PTL Lv  3 Current           2 Term 03/02/21 [redacted]w[redacted]d / 00:17 3130 g M Vag-Spont None  LIV  1 Term 08/28/17 [redacted]w[redacted]d / 01:03 3160 g M Vag-Spont None  LIV     I spent 20 minutes reviewing the patients chart, including labs and images as well as counseling the patient about her medical conditions. Greater than 50% of the time was spent in direct face-to-face patient counseling.  Penney Bowling  MFM, [facility]   07/22/2023  3:48 PM

## 2023-07-29 ENCOUNTER — Other Ambulatory Visit

## 2023-08-05 ENCOUNTER — Ambulatory Visit

## 2023-08-11 ENCOUNTER — Ambulatory Visit
